# Patient Record
Sex: Female | Born: 1990 | Race: Black or African American | Hispanic: No | Marital: Single | Smoking: Never smoker
Health system: Southern US, Community
[De-identification: ages and names within clinical notes are randomized; demographics above are authoritative.]

## PROBLEM LIST (undated history)

## (undated) ENCOUNTER — Inpatient Hospital Stay (HOSPITAL_COMMUNITY): Payer: Self-pay

## (undated) DIAGNOSIS — Z789 Other specified health status: Secondary | ICD-10-CM

## (undated) HISTORY — PX: NOSE SURGERY: SHX723

---

## 2013-10-01 DIAGNOSIS — B951 Streptococcus, group B, as the cause of diseases classified elsewhere: Secondary | ICD-10-CM | POA: Insufficient documentation

## 2013-11-29 ENCOUNTER — Encounter: Payer: Self-pay | Admitting: *Deleted

## 2013-12-06 ENCOUNTER — Encounter: Payer: Self-pay | Admitting: Obstetrics and Gynecology

## 2013-12-20 ENCOUNTER — Ambulatory Visit (INDEPENDENT_AMBULATORY_CARE_PROVIDER_SITE_OTHER): Payer: BC Managed Care – PPO | Admitting: Obstetrics & Gynecology

## 2013-12-20 ENCOUNTER — Encounter: Payer: Self-pay | Admitting: Obstetrics & Gynecology

## 2013-12-20 VITALS — BP 126/84 | Temp 97.3°F | Ht 66.0 in | Wt 222.9 lb

## 2013-12-20 DIAGNOSIS — O2390 Unspecified genitourinary tract infection in pregnancy, unspecified trimester: Secondary | ICD-10-CM

## 2013-12-20 DIAGNOSIS — N390 Urinary tract infection, site not specified: Secondary | ICD-10-CM

## 2013-12-20 DIAGNOSIS — Z3490 Encounter for supervision of normal pregnancy, unspecified, unspecified trimester: Secondary | ICD-10-CM | POA: Insufficient documentation

## 2013-12-20 DIAGNOSIS — Z23 Encounter for immunization: Secondary | ICD-10-CM

## 2013-12-20 DIAGNOSIS — B951 Streptococcus, group B, as the cause of diseases classified elsewhere: Secondary | ICD-10-CM

## 2013-12-20 DIAGNOSIS — O3663X1 Maternal care for excessive fetal growth, third trimester, fetus 1: Secondary | ICD-10-CM

## 2013-12-20 DIAGNOSIS — O3660X0 Maternal care for excessive fetal growth, unspecified trimester, not applicable or unspecified: Secondary | ICD-10-CM

## 2013-12-20 DIAGNOSIS — Z3493 Encounter for supervision of normal pregnancy, unspecified, third trimester: Secondary | ICD-10-CM

## 2013-12-20 LAB — POCT URINALYSIS DIP (DEVICE)
Bilirubin Urine: NEGATIVE
Glucose, UA: NEGATIVE mg/dL
Hgb urine dipstick: NEGATIVE
Specific Gravity, Urine: >=1.03 (ref 1.005–1.030)
Urobilinogen, UA: 0.2 mg/dL (ref 0.0–1.0)
pH: 5.5 (ref 5.0–8.0)

## 2013-12-20 LAB — CBC
MCH: 27.1 pg (ref 26.0–34.0)
MCHC: 34.6 g/dL (ref 30.0–36.0)
MCV: 78.3 fL (ref 78.0–100.0)
Platelets: 251 10*3/uL (ref 150–400)
RDW: 14 % (ref 11.5–15.5)

## 2013-12-20 MED ORDER — TETANUS-DIPHTH-ACELL PERTUSSIS 5-2.5-18.5 LF-MCG/0.5 IM SUSP
0.5000 mL | Freq: Once | INTRAMUSCULAR | Status: AC
  Administered 2013-12-20: 0.5 mL via INTRAMUSCULAR

## 2013-12-20 NOTE — Progress Notes (Signed)
Transferred from [facility].  Uncomplicated pregnancy thus far.  Awaiting lab records from [facility]. Third trimester labs today, Tdap also given.  Ultrasound ordered for large for dates. No other complaints or concerns.  Fetal movement and labor precautions reviewed.

## 2013-12-20 NOTE — Progress Notes (Signed)
U/S scheduled 12/24/13 at 8 am.

## 2013-12-20 NOTE — Patient Instructions (Signed)
Return to clinic for any obstetric concerns or go to [facility] for evaluation Breastfeeding Deciding to breastfeed is one of the best choices you can make for you and your baby. A change in hormones during pregnancy causes your breast tissue to grow and increases the number and size of your milk ducts. These hormones also allow proteins, sugars, and fats from your blood supply to make breast milk in your milk-producing glands. Hormones prevent breast milk from being released before your baby is born as well as prompt milk flow after birth. Once breastfeeding has begun, thoughts of your baby, as well as his or her sucking or crying, can stimulate the release of milk from your milk-producing glands.  BENEFITS OF BREASTFEEDING For Your Baby  Your first milk (colostrum) helps your baby's digestive system function better.   There are antibodies in your milk that help your baby fight off infections.   Your baby has a lower incidence of asthma, allergies, and sudden infant death syndrome.   The nutrients in breast milk are better for your baby than infant formulas and are designed uniquely for your baby's needs.   Breast milk improves your baby's brain development.   Your baby is less likely to develop other conditions, such as childhood obesity, asthma, or type 2 diabetes mellitus.  For You   Breastfeeding helps to create a very special bond between you and your baby.   Breastfeeding is convenient. Breast milk is always available at the correct temperature and costs nothing.   Breastfeeding helps to burn calories and helps you lose the weight gained during pregnancy.   Breastfeeding makes your uterus contract to its prepregnancy size faster and slows bleeding (lochia) after you give birth.   Breastfeeding helps to lower your risk of developing type 2 diabetes mellitus, osteoporosis, and breast or ovarian cancer later in life. SIGNS THAT YOUR BABY IS HUNGRY Early Signs of  Hunger  Increased alertness or activity.  Stretching.  Movement of the head from side to side.  Movement of the head and opening of the mouth when the corner of the mouth or cheek is stroked (rooting).  Increased sucking sounds, smacking lips, cooing, sighing, or squeaking.  Hand-to-mouth movements.  Increased sucking of fingers or hands. Late Signs of Hunger  Fussing.  Intermittent crying. Extreme Signs of Hunger Signs of extreme hunger will require calming and consoling before your baby will be able to breastfeed successfully. Do not wait for the following signs of extreme hunger to occur before you initiate breastfeeding:   Restlessness.  A loud, strong cry.   Screaming. BREASTFEEDING BASICS Breastfeeding Initiation  Find a comfortable place to sit or lie down, with your neck and back well supported.  Place a pillow or rolled up blanket under your baby to bring him or her to the level of your breast (if you are seated). Nursing pillows are specially designed to help support your arms and your baby while you breastfeed.  Make sure that your baby's abdomen is facing your abdomen.   Gently massage your breast. With your fingertips, massage from your chest wall toward your nipple in a circular motion. This encourages milk flow. You may need to continue this action during the feeding if your milk flows slowly.  Support your breast with 4 fingers underneath and your thumb above your nipple. Make sure your fingers are well away from your nipple and your baby's mouth.   Stroke your baby's lips gently with your finger or nipple.   When   your baby's mouth is open wide enough, quickly bring your baby to your breast, placing your entire nipple and as much of the colored area around your nipple (areola) as possible into your baby's mouth.   More areola should be visible above your baby's upper lip than below the lower lip.   Your baby's tongue should be between his or her  lower gum and your breast.   Ensure that your baby's mouth is correctly positioned around your nipple (latched). Your baby's lips should create a seal on your breast and be turned out (everted).  It is common for your baby to suck about 2 3 minutes in order to start the flow of breast milk. Latching Teaching your baby how to latch on to your breast properly is very important. An improper latch can cause nipple pain and decreased milk supply for you and poor weight gain in your baby. Also, if your baby is not latched onto your nipple properly, he or she may swallow some air during feeding. This can make your baby fussy. Burping your baby when you switch breasts during the feeding can help to get rid of the air. However, teaching your baby to latch on properly is still the best way to prevent fussiness from swallowing air while breastfeeding. Signs that your baby has successfully latched on to your nipple:    Silent tugging or silent sucking, without causing you pain.   Swallowing heard between every 3 4 sucks.    Muscle movement above and in front of his or her ears while sucking.  Signs that your baby has not successfully latched on to nipple:   Sucking sounds or smacking sounds from your baby while breastfeeding.  Nipple pain. If you think your baby has not latched on correctly, slip your finger into the corner of your baby's mouth to break the suction and place it between your baby's gums. Attempt breastfeeding initiation again. Signs of Successful Breastfeeding Signs from your baby:   A gradual decrease in the number of sucks or complete cessation of sucking.   Falling asleep.   Relaxation of his or her body.   Retention of a small amount of milk in his or her mouth.   Letting go of your breast by himself or herself. Signs from you:  Breasts that have increased in firmness, weight, and size 1 3 hours after feeding.   Breasts that are softer immediately after  breastfeeding.  Increased milk volume, as well as a change in milk consistency and color by the 5th day of breastfeeding.   Nipples that are not sore, cracked, or bleeding. Signs That Your Baby is Getting Enough Milk  Wetting at least 3 diapers in a 24-hour period. The urine should be clear and pale yellow by age 5 days.  At least 3 stools in a 24-hour period by age 5 days. The stool should be soft and yellow.  At least 3 stools in a 24-hour period by age 7 days. The stool should be seedy and yellow.  No loss of weight greater than 10% of birth weight during the first 3 days of age.  Average weight gain of 4 7 ounces (120 210 mL) per week after age 4 days.  Consistent daily weight gain by age 5 days, without weight loss after the age of 2 weeks. After a feeding, your baby may spit up a small amount. This is common. BREASTFEEDING FREQUENCY AND DURATION Frequent feeding will help you make more milk and can prevent   sore nipples and breast engorgement. Breastfeed when you feel the need to reduce the fullness of your breasts or when your baby shows signs of hunger. This is called "breastfeeding on demand." Avoid introducing a pacifier to your baby while you are working to establish breastfeeding (the first 4 6 weeks after your baby is born). After this time you may choose to use a pacifier. Research has shown that pacifier use during the first year of a baby's life decreases the risk of sudden infant death syndrome (SIDS). Allow your baby to feed on each breast as long as he or she wants. Breastfeed until your baby is finished feeding. When your baby unlatches or falls asleep while feeding from the first breast, offer the second breast. Because newborns are often sleepy in the first few weeks of life, you may need to awaken your baby to get him or her to feed. Breastfeeding times will vary from baby to baby. However, the following rules can serve as a guide to help you ensure that your baby is  properly fed:  Newborns (babies 4 weeks of age or younger) may breastfeed every 1 3 hours.  Newborns should not go longer than 3 hours during the day or 5 hours during the night without breastfeeding.  You should breastfeed your baby a minimum of 8 times in a 24-hour period until you begin to introduce solid foods to your baby at around 6 months of age. BREAST MILK PUMPING Pumping and storing breast milk allows you to ensure that your baby is exclusively fed your breast milk, even at times when you are unable to breastfeed. This is especially important if you are going back to work while you are still breastfeeding or when you are not able to be present during feedings. Your lactation consultant can give you guidelines on how long it is safe to store breast milk.  A breast pump is a machine that allows you to pump milk from your breast into a sterile bottle. The pumped breast milk can then be stored in a refrigerator or freezer. Some breast pumps are operated by hand, while others use electricity. Ask your lactation consultant which type will work best for you. Breast pumps can be purchased, but some hospitals and breastfeeding support groups lease breast pumps on a monthly basis. A lactation consultant can teach you how to hand express breast milk, if you prefer not to use a pump.  CARING FOR YOUR BREASTS WHILE YOU BREASTFEED Nipples can become dry, cracked, and sore while breastfeeding. The following recommendations can help keep your breasts moisturized and healthy:  Avoid using soap on your nipples.   Wear a supportive bra. Although not required, special nursing bras and tank tops are designed to allow access to your breasts for breastfeeding without taking off your entire bra or top. Avoid wearing underwire style bras or extremely tight bras.  Air dry your nipples for 3 4minutes after each feeding.   Use only cotton bra pads to absorb leaked breast milk. Leaking of breast milk between  feedings is normal.   Use lanolin on your nipples after breastfeeding. Lanolin helps to maintain your skin's normal moisture barrier. If you use pure lanolin you do not need to wash it off before feeding your baby again. Pure lanolin is not toxic to your baby. You may also hand express a few drops of breast milk and gently massage that milk into your nipples and allow the milk to air dry. In the first few weeks   after giving birth, some women experience extremely full breasts (engorgement). Engorgement can make your breasts feel heavy, warm, and tender to the touch. Engorgement peaks within 3 5 days after you give birth. The following recommendations can help ease engorgement:  Completely empty your breasts while breastfeeding or pumping. You may want to start by applying warm, moist heat (in the shower or with warm water-soaked hand towels) just before feeding or pumping. This increases circulation and helps the milk flow. If your baby does not completely empty your breasts while breastfeeding, pump any extra milk after he or she is finished.  Wear a snug bra (nursing or regular) or tank top for 1 2 days to signal your body to slightly decrease milk production.  Apply ice packs to your breasts, unless this is too uncomfortable for you.  Make sure that your baby is latched on and positioned properly while breastfeeding. If engorgement persists after 48 hours of following these recommendations, contact your health care provider or a lactation consultant. OVERALL HEALTH CARE RECOMMENDATIONS WHILE BREASTFEEDING  Eat healthy foods. Alternate between meals and snacks, eating 3 of each per day. Because what you eat affects your breast milk, some of the foods may make your baby more irritable than usual. Avoid eating these foods if you are sure that they are negatively affecting your baby.  Drink milk, fruit juice, and water to satisfy your thirst (about 10 glasses a day).   Rest often, relax, and  continue to take your prenatal vitamins to prevent fatigue, stress, and anemia.  Continue breast self-awareness checks.  Avoid chewing and smoking tobacco.  Avoid alcohol and drug use. Some medicines that may be harmful to your baby can pass through breast milk. It is important to ask your health care provider before taking any medicine, including all over-the-counter and prescription medicine as well as vitamin and herbal supplements. It is possible to become pregnant while breastfeeding. If birth control is desired, ask your health care provider about options that will be safe for your baby. SEEK MEDICAL CARE IF:   You feel like you want to stop breastfeeding or have become frustrated with breastfeeding.  You have painful breasts or nipples.  Your nipples are cracked or bleeding.  Your breasts are red, tender, or warm.  You have a swollen area on either breast.  You have a fever or chills.  You have nausea or vomiting.  You have drainage other than breast milk from your nipples.  Your breasts do not become full before feedings by the 5th day after you give birth.  You feel sad and depressed.  Your baby is too sleepy to eat well.  Your baby is having trouble sleeping.   Your baby is wetting less than 3 diapers in a 24-hour period.  Your baby has less than 3 stools in a 24-hour period.  Your baby's skin or the white part of his or her eyes becomes yellow.   Your baby is not gaining weight by 5 days of age. SEEK IMMEDIATE MEDICAL CARE IF:   Your baby is overly tired (lethargic) and does not want to wake up and feed.  Your baby develops an unexplained fever. Document Released: 12/16/2005 Document Revised: 08/18/2013 Document Reviewed: 06/09/2013 [employer] Patient Information 2014 [employer].  

## 2013-12-20 NOTE — Progress Notes (Signed)
Nutrition note: 1st visit consult Pt has h/o obesity.  Pt has lost 1.1# @ [redacted]w[redacted]d but pt reports her lowest wt was 218# and so has gained wt recently. Pt reports eating only 2x/d due to no appetite. Diet appears low in fruits & vegetables.  Pt reports no more N/V but has some heartburn. Pt is taking PNV. Pt received verbal & written education on general nutrition during pregnancy. Discussed tips to decrease heartburn. Encouraged pt to add 2-3 energy dense snacks/d. Discussed benefits of BF. Encouraged fruits & vegetables daily.  Discussed wt gain goals of 11-20# or 0.5#/wk. Pt agrees to continue taking PNV and try to add snacks throughout the day. Pt does not have WIC but plans to apply. Pt plans to BF. F/u in 2-4 wks Blondell Reveal, MS, RD, LDN, [facility]

## 2013-12-20 NOTE — Progress Notes (Signed)
Pulse- 92 Patient reports hip pain

## 2013-12-21 ENCOUNTER — Encounter: Payer: Self-pay | Admitting: *Deleted

## 2013-12-21 LAB — PRESCRIPTION MONITORING PROFILE (19 PANEL)
Amphetamine/Meth: NEGATIVE ng/mL
Barbiturate Screen, Urine: NEGATIVE ng/mL
Benzodiazepine Screen, Urine: NEGATIVE ng/mL
Buprenorphine, Urine: NEGATIVE ng/mL
Cannabinoid Scrn, Ur: NEGATIVE ng/mL
Carisoprodol, Urine: NEGATIVE ng/mL
Cocaine Metabolites: NEGATIVE ng/mL
Creatinine, Urine: 415.23 mg/dL
Fentanyl, Ur: NEGATIVE ng/mL
MDMA URINE: NEGATIVE ng/mL
Meperidine, Ur: NEGATIVE ng/mL
Methadone Screen, Urine: NEGATIVE ng/mL
Methaqualone: NEGATIVE ng/mL
Nitrites, Initial: NEGATIVE ug/mL
Opiate Screen, Urine: NEGATIVE ng/mL
Oxycodone Screen, Ur: NEGATIVE ng/mL
Phencyclidine, Ur: NEGATIVE ng/mL
Propoxyphene: NEGATIVE ng/mL
Tapentadol, urine: NEGATIVE ng/mL
Tramadol Scrn, Ur: NEGATIVE ng/mL
Zolpidem, Urine: NEGATIVE ng/mL
pH, Initial: 5.8 pH (ref 4.5–8.9)

## 2013-12-24 ENCOUNTER — Ambulatory Visit (HOSPITAL_COMMUNITY)
Admission: RE | Admit: 2013-12-24 | Discharge: 2013-12-24 | Disposition: A | Discharge status: Home / Self Care | Payer: BC Managed Care – PPO | Source: Ambulatory Visit | Attending: Obstetrics & Gynecology | Admitting: Obstetrics & Gynecology

## 2013-12-24 DIAGNOSIS — O3663X1 Maternal care for excessive fetal growth, third trimester, fetus 1: Secondary | ICD-10-CM

## 2013-12-24 DIAGNOSIS — Z3689 Encounter for other specified antenatal screening: Secondary | ICD-10-CM | POA: Insufficient documentation

## 2013-12-24 DIAGNOSIS — E669 Obesity, unspecified: Secondary | ICD-10-CM | POA: Insufficient documentation

## 2013-12-24 DIAGNOSIS — O3660X0 Maternal care for excessive fetal growth, unspecified trimester, not applicable or unspecified: Secondary | ICD-10-CM | POA: Insufficient documentation

## 2013-12-24 DIAGNOSIS — Z3493 Encounter for supervision of normal pregnancy, unspecified, third trimester: Secondary | ICD-10-CM

## 2013-12-27 ENCOUNTER — Encounter: Payer: Self-pay | Admitting: Obstetrics & Gynecology

## 2013-12-27 DIAGNOSIS — O99210 Obesity complicating pregnancy, unspecified trimester: Secondary | ICD-10-CM | POA: Insufficient documentation

## 2013-12-30 NOTE — L&D Delivery Note (Signed)
Delivery Note At 7:24 PM a viable female was delivered via Vaginal, Spontaneous Delivery (Presentation: LOA ).  APGAR: 9, 9; weight 5 lb 14 oz (2665 g).   Placenta status: Intact, Spontaneous.  Cord: 3 vessels with the following complications: tight body cord wrapped around one leg, one arm and the neck.    Anesthesia: Epidural  Episiotomy: None Lacerations: Labial;Sulcus Suture Repair: 3.0 vicryl rapide Est. Blood Loss (mL): 350  Mom to postpartum.  Baby to Couplet care / Skin to Skin.  Pt pushed with good maternal effort with 3 contractions to deliver a liveborn female with a spontaneous cry. Tight body cord unable to to removed on perineum but delivered through without complication.  Delayed cord clamping performed. Placenta delivered intact with 3V cord with traction and pit.  Small sulcal tear requiring a single figure of 8 for hemostasis and a left labial requiring a running suture for hemostasis were repaired without difficulty.  No complications.  Baby to skin to skin and mom to postpartum.    Rance Smithson L 03/09/2014, 8:50 PM

## 2014-01-03 ENCOUNTER — Ambulatory Visit (INDEPENDENT_AMBULATORY_CARE_PROVIDER_SITE_OTHER): Payer: BC Managed Care – PPO | Admitting: Family Medicine

## 2014-01-03 VITALS — BP 127/84 | Temp 98.1°F | Wt 225.5 lb

## 2014-01-03 DIAGNOSIS — N390 Urinary tract infection, site not specified: Secondary | ICD-10-CM

## 2014-01-03 DIAGNOSIS — B951 Streptococcus, group B, as the cause of diseases classified elsewhere: Secondary | ICD-10-CM

## 2014-01-03 DIAGNOSIS — O2390 Unspecified genitourinary tract infection in pregnancy, unspecified trimester: Secondary | ICD-10-CM

## 2014-01-03 DIAGNOSIS — Z3491 Encounter for supervision of normal pregnancy, unspecified, first trimester: Secondary | ICD-10-CM

## 2014-01-03 DIAGNOSIS — Z23 Encounter for immunization: Secondary | ICD-10-CM

## 2014-01-03 DIAGNOSIS — O2340 Unspecified infection of urinary tract in pregnancy, unspecified trimester: Principal | ICD-10-CM

## 2014-01-03 LAB — POCT URINALYSIS DIP (DEVICE)
BILIRUBIN URINE: NEGATIVE
Glucose, UA: NEGATIVE mg/dL
Hgb urine dipstick: NEGATIVE
LEUKOCYTES UA: NEGATIVE
Nitrite: NEGATIVE
PROTEIN: NEGATIVE mg/dL
Urobilinogen, UA: 1 mg/dL (ref 0.0–1.0)
pH: 6 (ref 5.0–8.0)

## 2014-01-03 NOTE — Progress Notes (Signed)
No LOF, no vb, +braxton hicks 1x/week, Normal FM  Miakoda Mabry is a 23 y.o. G1P0000 at 4837w3d by L=8 here for ROB visit.  Discussed with Patient:  -Plans to breast feed.  All questions answered. -Continue prenatal vitamins. -Reviewed fetal kick counts (Pt to perform daily at a time when the baby is active, lie laterally with both hands on belly in quiet room and count all movements (hiccups, shoulder rolls, obvious kicks, etc); pt is to report to clinic or [facility] for less than 10 movements felt in a one hour time period-pt told as soon as she counts 10 movements the count is complete.)  - Routine precautions discussed (depression, infection s/s).   Patient provided with all pertinent phone numbers for emergencies. - RTC for any VB, regular, painful cramps/ctxs occurring at a rate of >2/10 min, fever (100.5 or higher), n/v/d, any pain that is unresolving or worsening, LOF, decreased fetal movement, CP, SOB, edema  Problems: Patient Active Problem List   Diagnosis Date Noted  . Maternal obesity, antepartum 12/27/2013  . Supervision of normal pregnancy 12/20/2013  . GBS (group B streptococcus) UTI complicating pregnancy 10/01/2013    To Do:   [ ]  Vaccines: Flu: declines Tdap: recd [ ]  BCM: skyla vs nuvaring  Edu: [x ] PTL precautions; [ ]  BF class; [ ]  childbirth class; [ ]   BF counseling;

## 2014-01-03 NOTE — Progress Notes (Signed)
P= 82 C/o of heartburn this morning.

## 2014-01-03 NOTE — Addendum Note (Signed)
Addended by: Louanna RawAMPBELL, Kamaron Deskins M on: 01/03/2014 09:58 AM   Modules accepted: Orders

## 2014-01-03 NOTE — Patient Instructions (Signed)
Third Trimester of Pregnancy  The third trimester is from week 29 through week 42, months 7 through 9. The third trimester is a time when the fetus is growing rapidly. At the end of the ninth month, the fetus is about 20 inches in length and weighs 6 10 pounds.   BODY CHANGES  Your body goes through many changes during pregnancy. The changes vary from woman to woman.    Your weight will continue to increase. You can expect to gain 25 35 pounds (11 16 kg) by the end of the pregnancy.   You may begin to get stretch marks on your hips, abdomen, and breasts.   You may urinate more often because the fetus is moving lower into your pelvis and pressing on your bladder.   You may develop or continue to have heartburn as a result of your pregnancy.   You may develop constipation because certain hormones are causing the muscles that push waste through your intestines to slow down.   You may develop hemorrhoids or swollen, bulging veins (varicose veins).   You may have pelvic pain because of the weight gain and pregnancy hormones relaxing your joints between the bones in your pelvis. Back aches may result from over exertion of the muscles supporting your posture.   Your breasts will continue to grow and be tender. A yellow discharge may leak from your breasts called colostrum.   Your belly button may stick out.   You may feel short of breath because of your expanding uterus.   You may notice the fetus "dropping," or moving lower in your abdomen.   You may have a bloody mucus discharge. This usually occurs a few days to a week before labor begins.   Your cervix becomes thin and soft (effaced) near your due date.  WHAT TO EXPECT AT YOUR PRENATAL EXAMS   You will have prenatal exams every 2 weeks until week 36. Then, you will have weekly prenatal exams. During a routine prenatal visit:   You will be weighed to make sure you and the fetus are growing normally.   Your blood pressure is taken.   Your abdomen will be  measured to track your baby's growth.   The fetal heartbeat will be listened to.   Any test results from the previous visit will be discussed.   You may have a cervical check near your due date to see if you have effaced.  At around 36 weeks, your caregiver will check your cervix. At the same time, your caregiver will also perform a test on the secretions of the vaginal tissue. This test is to determine if a type of bacteria, Group B streptococcus, is present. Your caregiver will explain this further.  Your caregiver may ask you:   What your birth plan is.   How you are feeling.   If you are feeling the baby move.   If you have had any abnormal symptoms, such as leaking fluid, bleeding, severe headaches, or abdominal cramping.   If you have any questions.  Other tests or screenings that may be performed during your third trimester include:   Blood tests that check for low iron levels (anemia).   Fetal testing to check the health, activity level, and growth of the fetus. Testing is done if you have certain medical conditions or if there are problems during the pregnancy.  FALSE LABOR  You may feel small, irregular contractions that eventually go away. These are called Braxton Hicks contractions, or   false labor. Contractions may last for hours, days, or even weeks before true labor sets in. If contractions come at regular intervals, intensify, or become painful, it is best to be seen by your caregiver.   SIGNS OF LABOR    Menstrual-like cramps.   Contractions that are 5 minutes apart or less.   Contractions that start on the top of the uterus and spread down to the lower abdomen and back.   A sense of increased pelvic pressure or back pain.   A watery or bloody mucus discharge that comes from the vagina.  If you have any of these signs before the 37th week of pregnancy, call your caregiver right away. You need to go to the hospital to get checked immediately.  HOME CARE INSTRUCTIONS    Avoid all  smoking, herbs, alcohol, and unprescribed drugs. These chemicals affect the formation and growth of the baby.   Follow your caregiver's instructions regarding medicine use. There are medicines that are either safe or unsafe to take during pregnancy.   Exercise only as directed by your caregiver. Experiencing uterine cramps is a good sign to stop exercising.   Continue to eat regular, healthy meals.   Wear a good support bra for breast tenderness.   Do not use hot tubs, steam rooms, or saunas.   Wear your seat belt at all times when driving.   Avoid raw meat, uncooked cheese, cat litter boxes, and soil used by cats. These carry germs that can cause birth defects in the baby.   Take your prenatal vitamins.   Try taking a stool softener (if your caregiver approves) if you develop constipation. Eat more high-fiber foods, such as fresh vegetables or fruit and whole grains. Drink plenty of fluids to keep your urine clear or pale yellow.   Take warm sitz baths to soothe any pain or discomfort caused by hemorrhoids. Use hemorrhoid cream if your caregiver approves.   If you develop varicose veins, wear support hose. Elevate your feet for 15 minutes, 3 4 times a day. Limit salt in your diet.   Avoid heavy lifting, wear low heal shoes, and practice good posture.   Rest a lot with your legs elevated if you have leg cramps or low back pain.   Visit your dentist if you have not gone during your pregnancy. Use a soft toothbrush to brush your teeth and be gentle when you floss.   A sexual relationship may be continued unless your caregiver directs you otherwise.   Do not travel far distances unless it is absolutely necessary and only with the approval of your caregiver.   Take prenatal classes to understand, practice, and ask questions about the labor and delivery.   Make a trial run to the hospital.   Pack your hospital bag.   Prepare the baby's nursery.   Continue to go to all your prenatal visits as directed  by your caregiver.  SEEK MEDICAL CARE IF:   You are unsure if you are in labor or if your water has broken.   You have dizziness.   You have mild pelvic cramps, pelvic pressure, or nagging pain in your abdominal area.   You have persistent nausea, vomiting, or diarrhea.   You have a bad smelling vaginal discharge.   You have pain with urination.  SEEK IMMEDIATE MEDICAL CARE IF:    You have a fever.   You are leaking fluid from your vagina.   You have spotting or bleeding from your vagina.     You have severe abdominal cramping or pain.   You have rapid weight loss or gain.   You have shortness of breath with chest pain.   You notice sudden or extreme swelling of your face, hands, ankles, feet, or legs.   You have not felt your baby move in over an hour.   You have severe headaches that do not go away with medicine.   You have vision changes.  Document Released: 12/10/2001 Document Revised: 08/18/2013 Document Reviewed: 02/16/2013  Exit[employer] Patient Information 2014 [employer].

## 2014-01-05 ENCOUNTER — Encounter: Payer: Self-pay | Admitting: *Deleted

## 2014-01-10 ENCOUNTER — Encounter (HOSPITAL_COMMUNITY): Payer: Self-pay

## 2014-01-10 ENCOUNTER — Inpatient Hospital Stay (HOSPITAL_COMMUNITY)
Admission: AD | Admit: 2014-01-10 | Discharge: 2014-01-10 | Disposition: A | Discharge status: Home / Self Care | Payer: BC Managed Care – PPO | Source: Ambulatory Visit | Attending: Family Medicine | Admitting: Family Medicine

## 2014-01-10 DIAGNOSIS — M545 Low back pain, unspecified: Secondary | ICD-10-CM | POA: Insufficient documentation

## 2014-01-10 DIAGNOSIS — O479 False labor, unspecified: Secondary | ICD-10-CM

## 2014-01-10 DIAGNOSIS — O4700 False labor before 37 completed weeks of gestation, unspecified trimester: Secondary | ICD-10-CM | POA: Insufficient documentation

## 2014-01-10 LAB — URINALYSIS, ROUTINE W REFLEX MICROSCOPIC
BILIRUBIN URINE: NEGATIVE
GLUCOSE, UA: NEGATIVE mg/dL
Hgb urine dipstick: NEGATIVE
Ketones, ur: NEGATIVE mg/dL
Leukocytes, UA: NEGATIVE
NITRITE: NEGATIVE
PH: 6 (ref 5.0–8.0)
Protein, ur: NEGATIVE mg/dL
SPECIFIC GRAVITY, URINE: 1.01 (ref 1.005–1.030)
Urobilinogen, UA: 0.2 mg/dL (ref 0.0–1.0)

## 2014-01-10 NOTE — Discharge Instructions (Signed)

## 2014-01-10 NOTE — MAU Note (Signed)
Pt reports abd pain earlier today, back pain last night. No pain now. Denies bleeding or ROM.

## 2014-01-10 NOTE — MAU Provider Note (Signed)
Chief Complaint:  Contractions  First Contact Initiated at 10:45 PM  HPI: Alexis Lambert is a 23 y.o. G1P0000 at 6876w3d who presents to maternity admissions reporting low back pain last night and abdominal discomfort today.  Described as lower abdominal pain that wax and waned, along with her back pain, which is non radiating.  She denies any dysuria, hematuria, vaginal discharge, RLQ pain, fever, chills, or sweats.  Denies contractions, leakage of fluid or vaginal bleeding. Good fetal movement.   Pregnancy Course: Uncomplicated  Past Medical History: No past medical history on file.  Past obstetric history: OB History  Gravida Para Term Preterm AB SAB TAB Ectopic Multiple Living  1 0 0 0 0 0 0 0 0 0     # Outcome Date GA Lbr Len/2nd Weight Sex Delivery Anes PTL Lv  1 CUR               Past Surgical History: Past Surgical History  Procedure Laterality Date  . Nose surgery       Family History: Family History  Problem Relation Age of Onset  . Sarcoidosis Mother   . Hypertension Father   . Diabetes Paternal Grandmother   . Hypertension Paternal Grandmother     Social History: History  Substance Use Topics  . Smoking status: Never Smoker   . Smokeless tobacco: Never Used  . Alcohol Use: No    Allergies: No Known Allergies  Meds:  Prescriptions prior to admission  Medication Sig Dispense Refill  . Prenatal Vit-Fe Fumarate-FA (PRENATAL VITAMINS PLUS) 27-1 MG TABS Take by mouth.        ROS: Pertinent findings in history of present illness.  Physical Exam  Blood pressure 137/80, pulse 93, temperature 98.8 F (37.1 C), temperature source Oral, resp. rate 18, height 5\' 6"  (1.676 m), weight 104.327 kg (230 lb), last menstrual period 06/04/2013, SpO2 99.00%. GENERAL: Well-developed, well-nourished female in no acute distress.  HEENT: normocephalic HEART: normal rate RESP: normal effort ABDOMEN: Soft, non-tender, gravid appropriate for gestational age EXTREMITIES:  Nontender, no edema NEURO: alert and oriented Dilation: Closed Effacement (%): Thick Cervical Position: Posterior Station: -3 Exam by:: Dr. Paulina FusiHess   FHT:  Baseline 140 , moderate variability, accelerations present, no decelerations Contractions: Absent    Labs: No results found for this or any previous visit (from the past 24 hour(s)).  Imaging:  [place] Ob Comp + 14 Wk  12/24/2013   OBSTETRICAL ULTRASOUND: This exam was performed within a [facility]. The OB US report was generated in the [facility] system, and faxed to the ordering physician.   This report is also available in [facility]'s [employer] and in the [facility] [employer] PACS. See [facility] Obstetric US report.   Assessment: 1. Braxton Hick's contraction     Plan: Discharge home Reassuring FHT Labor precautions and fetal kick counts    Medication List    ASK your doctor about these medications       prenatal multivitamin Tabs tablet  Take 1 tablet by mouth daily at 12 noon.        Briscoe DeutscherBryan R Hess, DO 01/10/2014 10:08 PM  I spoke with the resident and reviewed his note regarding patient and agree with and plan of care.  Tawana ScaleMichael Ryan Quaniya Damas, MD OB Fellow 01/11/2014 12:43 AM

## 2014-01-11 NOTE — MAU Provider Note (Signed)
Attestation of Attending Supervision of Advanced Practitioner (PA/CNM/NP): Evaluation and management procedures were performed by the Advanced Practitioner under my supervision and collaboration.  I have reviewed the Advanced Practitioner's note and chart, and I agree with the management and plan.  Reva BoresPRATT,Yanique Mulvihill S, MD [facility] Attending 01/11/2014 12:48 AM

## 2014-01-16 ENCOUNTER — Inpatient Hospital Stay (HOSPITAL_COMMUNITY)
Admission: AD | Admit: 2014-01-16 | Discharge: 2014-01-16 | Disposition: A | Discharge status: Home / Self Care | Payer: BC Managed Care – PPO | Source: Ambulatory Visit | Attending: Obstetrics & Gynecology | Admitting: Obstetrics & Gynecology

## 2014-01-16 ENCOUNTER — Encounter (HOSPITAL_COMMUNITY): Payer: Self-pay | Admitting: *Deleted

## 2014-01-16 DIAGNOSIS — R300 Dysuria: Secondary | ICD-10-CM | POA: Insufficient documentation

## 2014-01-16 DIAGNOSIS — N938 Other specified abnormal uterine and vaginal bleeding: Secondary | ICD-10-CM | POA: Insufficient documentation

## 2014-01-16 DIAGNOSIS — N390 Urinary tract infection, site not specified: Secondary | ICD-10-CM

## 2014-01-16 DIAGNOSIS — N949 Unspecified condition associated with female genital organs and menstrual cycle: Secondary | ICD-10-CM | POA: Insufficient documentation

## 2014-01-16 LAB — URINE MICROSCOPIC-ADD ON

## 2014-01-16 LAB — URINALYSIS, ROUTINE W REFLEX MICROSCOPIC
BILIRUBIN URINE: NEGATIVE
Glucose, UA: NEGATIVE mg/dL
Ketones, ur: 15 mg/dL — AB
Nitrite: NEGATIVE
PH: 6 (ref 5.0–8.0)
Protein, ur: NEGATIVE mg/dL
SPECIFIC GRAVITY, URINE: 1.02 (ref 1.005–1.030)
Urobilinogen, UA: 0.2 mg/dL (ref 0.0–1.0)

## 2014-01-16 MED ORDER — NITROFURANTOIN MONOHYD MACRO 100 MG PO CAPS: 100.0000 mg | ORAL_CAPSULE | Freq: Two times a day (BID) | ORAL | Status: DC

## 2014-01-16 NOTE — MAU Note (Signed)
Pt presents with complaints of vaginal bleeding and pain with urination this morning

## 2014-01-16 NOTE — MAU Provider Note (Signed)
History     CSN: [id number]  Arrival date and time: 01/16/14 [id number]   First Provider Initiated Contact with Patient 01/16/14 [phone]      Chief Complaint  Patient presents with  . Dysuria  . Vaginal Bleeding   HPI Comments: Alexis Lambert 22 y.o. G1P0000 presents to MAU with bleeding when she wiped after painful urination. She had intercourse last night. She is 32 weeks and 2 days pregnant. She admits she does not drink as much as she should because she is so busy.     Dysuria   Vaginal Bleeding Associated symptoms include dysuria.      History reviewed. No pertinent past medical history.  Past Surgical History  Procedure Laterality Date  . Nose surgery      Family History  Problem Relation Age of Onset  . Sarcoidosis Mother   . Hypertension Father   . Diabetes Paternal Grandmother   . Hypertension Paternal Grandmother     History  Substance Use Topics  . Smoking status: Never Smoker   . Smokeless tobacco: Never Used  . Alcohol Use: No    Allergies: No Known Allergies  Prescriptions prior to admission  Medication Sig Dispense Refill  . Prenatal Vit-Fe Fumarate-FA (PRENATAL MULTIVITAMIN) TABS tablet Take 1 tablet by mouth daily at 12 noon.        Review of Systems  Constitutional: Negative.        Denies any contractions  HENT: Negative.   Eyes: Negative.   Respiratory: Negative.   Cardiovascular: Negative.   Gastrointestinal: Negative.   Genitourinary: Positive for dysuria and vaginal bleeding.  Musculoskeletal: Negative.   Skin: Negative.   Neurological: Negative.   Psychiatric/Behavioral: Negative.    Physical Exam   Blood pressure 138/79, pulse 83, temperature 98.3 F (36.8 C), temperature source Oral, resp. rate 18, height 5\' 5"  (1.651 m), weight 105.235 kg (232 lb), last menstrual period 06/04/2013.  Physical Exam  Constitutional: She is oriented to person, place, and time. She appears well-developed and well-nourished. No distress.  HENT:   Head: Normocephalic and atraumatic.  Eyes: Pupils are equal, round, and reactive to light.  GI: Soft. She exhibits no distension and no mass. There is no tenderness. There is no rebound and no guarding.  Genitourinary:  Genital: external negative Vaginal: small amount white discharge. No Blood Cervix: long and closed Bimanual: negative    Musculoskeletal: Normal range of motion.  Neurological: She is alert and oriented to person, place, and time.  Skin: Skin is warm and dry.  Psychiatric: She has a normal mood and affect. Her behavior is normal. Judgment and thought content normal.   Results for orders placed during the hospital encounter of 01/16/14 (from the past 24 hour(s))  URINALYSIS, ROUTINE W REFLEX MICROSCOPIC     Status: Abnormal   Collection Time    01/16/14  7:35 AM      Result Value Range   Color, Urine YELLOW  YELLOW   APPearance CLEAR  CLEAR   Specific Gravity, Urine 1.020  1.005 - 1.030   pH 6.0  5.0 - 8.0   Glucose, UA NEGATIVE  NEGATIVE mg/dL   Hgb urine dipstick LARGE (*) NEGATIVE   Bilirubin Urine NEGATIVE  NEGATIVE   Ketones, ur 15 (*) NEGATIVE mg/dL   Protein, ur NEGATIVE  NEGATIVE mg/dL   Urobilinogen, UA 0.2  0.0 - 1.0 mg/dL   Nitrite NEGATIVE  NEGATIVE   Leukocytes, UA TRACE (*) NEGATIVE  URINE MICROSCOPIC-ADD ON     Status:  Abnormal   Collection Time    01/16/14  7:35 AM      Result Value Range   Squamous Epithelial / LPF FEW (*) RARE   WBC, UA 3-6  <3 WBC/hpf   RBC / HPF 11-20  <3 RBC/hpf   Urine-Other MUCOUS PRESENT       MAU Course  Procedures  MDM  Fern Negative FHR 150. Reassuring and no contractions  Assessment and Plan   A: UTI  P: Macrobid 100 mg bid x 7 days Lengthy discussion about increasing fluids Advised if any further issues develop to return Tylenol prn pain Requested note for work today   Carolynn Serve 01/16/2014, 9:18 AM

## 2014-01-16 NOTE — Discharge Instructions (Signed)
Urinary Tract Infection  Urinary tract infections (UTIs) can develop anywhere along your urinary tract. Your urinary tract is your body's drainage system for removing wastes and extra water. Your urinary tract includes two kidneys, two ureters, a bladder, and a urethra. Your kidneys are a pair of bean-shaped organs. Each kidney is about the size of your fist. They are located below your ribs, one on each side of your spine.  CAUSES  Infections are caused by microbes, which are microscopic organisms, including fungi, viruses, and bacteria. These organisms are so small that they can only be seen through a microscope. Bacteria are the microbes that most commonly cause UTIs.  SYMPTOMS   Symptoms of UTIs may vary by age and gender of the patient and by the location of the infection. Symptoms in young women typically include a frequent and intense urge to urinate and a painful, burning feeling in the bladder or urethra during urination. Older women and men are more likely to be tired, shaky, and weak and have muscle aches and abdominal pain. A fever may mean the infection is in your kidneys. Other symptoms of a kidney infection include pain in your back or sides below the ribs, nausea, and vomiting.  DIAGNOSIS  To diagnose a UTI, your caregiver will ask you about your symptoms. Your caregiver also will ask to provide a urine sample. The urine sample will be tested for bacteria and white blood cells. White blood cells are made by your body to help fight infection.  TREATMENT   Typically, UTIs can be treated with medication. Because most UTIs are caused by a bacterial infection, they usually can be treated with the use of antibiotics. The choice of antibiotic and length of treatment depend on your symptoms and the type of bacteria causing your infection.  HOME CARE INSTRUCTIONS   If you were prescribed antibiotics, take them exactly as your caregiver instructs you. Finish the medication even if you feel better after you  have only taken some of the medication.   Drink enough water and fluids to keep your urine clear or pale yellow.   Avoid caffeine, tea, and carbonated beverages. They tend to irritate your bladder.   Empty your bladder often. Avoid holding urine for long periods of time.   Empty your bladder before and after sexual intercourse.   After a bowel movement, women should cleanse from front to back. Use each tissue only once.  SEEK MEDICAL CARE IF:    You have back pain.   You develop a fever.   Your symptoms do not begin to resolve within 3 days.  SEEK IMMEDIATE MEDICAL CARE IF:    You have severe back pain or lower abdominal pain.   You develop chills.   You have nausea or vomiting.   You have continued burning or discomfort with urination.  MAKE SURE YOU:    Understand these instructions.   Will watch your condition.   Will get help right away if you are not doing well or get worse.  Document Released: 09/25/2005 Document Revised: 06/16/2012 Document Reviewed: 01/24/2012  [employer] Patient Information 2014 [employer].

## 2014-01-17 ENCOUNTER — Ambulatory Visit (INDEPENDENT_AMBULATORY_CARE_PROVIDER_SITE_OTHER): Payer: BC Managed Care – PPO | Admitting: Obstetrics and Gynecology

## 2014-01-17 ENCOUNTER — Encounter: Payer: Self-pay | Admitting: Obstetrics and Gynecology

## 2014-01-17 VITALS — BP 111/82 | Temp 97.0°F | Wt 231.2 lb

## 2014-01-17 DIAGNOSIS — O99210 Obesity complicating pregnancy, unspecified trimester: Secondary | ICD-10-CM

## 2014-01-17 DIAGNOSIS — B951 Streptococcus, group B, as the cause of diseases classified elsewhere: Secondary | ICD-10-CM

## 2014-01-17 DIAGNOSIS — IMO0002 Reserved for concepts with insufficient information to code with codable children: Secondary | ICD-10-CM

## 2014-01-17 DIAGNOSIS — N390 Urinary tract infection, site not specified: Secondary | ICD-10-CM

## 2014-01-17 DIAGNOSIS — O2390 Unspecified genitourinary tract infection in pregnancy, unspecified trimester: Secondary | ICD-10-CM

## 2014-01-17 DIAGNOSIS — Z3490 Encounter for supervision of normal pregnancy, unspecified, unspecified trimester: Secondary | ICD-10-CM

## 2014-01-17 DIAGNOSIS — O2340 Unspecified infection of urinary tract in pregnancy, unspecified trimester: Secondary | ICD-10-CM

## 2014-01-17 LAB — POCT URINALYSIS DIP (DEVICE)
BILIRUBIN URINE: NEGATIVE
GLUCOSE, UA: NEGATIVE mg/dL
Ketones, ur: NEGATIVE mg/dL
NITRITE: NEGATIVE
Protein, ur: NEGATIVE mg/dL
Specific Gravity, Urine: >=1.03 (ref 1.005–1.030)
Urobilinogen, UA: 0.2 mg/dL (ref 0.0–1.0)
pH: 6 (ref 5.0–8.0)

## 2014-01-17 NOTE — Progress Notes (Signed)
[id number] 

## 2014-01-17 NOTE — Progress Notes (Signed)
Patient is doing well without complaints. Was diagnosed with a UTI on 1/18 but did not pick up Rx yet. Advised to start treatment as soon as possible to prevent pyelo. Patient plans to breast feed and use NuvaRing for contraception. FM/PTL precautions reviewed.

## 2014-02-01 ENCOUNTER — Ambulatory Visit (INDEPENDENT_AMBULATORY_CARE_PROVIDER_SITE_OTHER): Payer: BC Managed Care – PPO | Admitting: Obstetrics and Gynecology

## 2014-02-01 ENCOUNTER — Encounter: Payer: Self-pay | Admitting: Obstetrics and Gynecology

## 2014-02-01 VITALS — BP 130/80 | Temp 98.5°F | Wt 238.0 lb

## 2014-02-01 DIAGNOSIS — O2390 Unspecified genitourinary tract infection in pregnancy, unspecified trimester: Secondary | ICD-10-CM

## 2014-02-01 DIAGNOSIS — B951 Streptococcus, group B, as the cause of diseases classified elsewhere: Secondary | ICD-10-CM

## 2014-02-01 DIAGNOSIS — N390 Urinary tract infection, site not specified: Secondary | ICD-10-CM

## 2014-02-01 DIAGNOSIS — O2340 Unspecified infection of urinary tract in pregnancy, unspecified trimester: Principal | ICD-10-CM

## 2014-02-01 LAB — POCT URINALYSIS DIP (DEVICE)
BILIRUBIN URINE: NEGATIVE
GLUCOSE, UA: NEGATIVE mg/dL
Hgb urine dipstick: NEGATIVE
KETONES UR: 40 mg/dL — AB
Nitrite: NEGATIVE
Protein, ur: 30 mg/dL — AB
Specific Gravity, Urine: >=1.03 (ref 1.005–1.030)
Urobilinogen, UA: 1 mg/dL (ref 0.0–1.0)
pH: 5.5 (ref 5.0–8.0)

## 2014-02-01 NOTE — Patient Instructions (Signed)
Third Trimester of Pregnancy  The third trimester is from week 29 through week 42, months 7 through 9. The third trimester is a time when the fetus is growing rapidly. At the end of the ninth month, the fetus is about 20 inches in length and weighs 6 10 pounds.   BODY CHANGES  Your body goes through many changes during pregnancy. The changes vary from woman to woman.    Your weight will continue to increase. You can expect to gain 25 35 pounds (11 16 kg) by the end of the pregnancy.   You may begin to get stretch marks on your hips, abdomen, and breasts.   You may urinate more often because the fetus is moving lower into your pelvis and pressing on your bladder.   You may develop or continue to have heartburn as a result of your pregnancy.   You may develop constipation because certain hormones are causing the muscles that push waste through your intestines to slow down.   You may develop hemorrhoids or swollen, bulging veins (varicose veins).   You may have pelvic pain because of the weight gain and pregnancy hormones relaxing your joints between the bones in your pelvis. Back aches may result from over exertion of the muscles supporting your posture.   Your breasts will continue to grow and be tender. A yellow discharge may leak from your breasts called colostrum.   Your belly button may stick out.   You may feel short of breath because of your expanding uterus.   You may notice the fetus "dropping," or moving lower in your abdomen.   You may have a bloody mucus discharge. This usually occurs a few days to a week before labor begins.   Your cervix becomes thin and soft (effaced) near your due date.  WHAT TO EXPECT AT YOUR PRENATAL EXAMS   You will have prenatal exams every 2 weeks until week 36. Then, you will have weekly prenatal exams. During a routine prenatal visit:   You will be weighed to make sure you and the fetus are growing normally.   Your blood pressure is taken.   Your abdomen will be  measured to track your baby's growth.   The fetal heartbeat will be listened to.   Any test results from the previous visit will be discussed.   You may have a cervical check near your due date to see if you have effaced.  At around 36 weeks, your caregiver will check your cervix. At the same time, your caregiver will also perform a test on the secretions of the vaginal tissue. This test is to determine if a type of bacteria, Group B streptococcus, is present. Your caregiver will explain this further.  Your caregiver may ask you:   What your birth plan is.   How you are feeling.   If you are feeling the baby move.   If you have had any abnormal symptoms, such as leaking fluid, bleeding, severe headaches, or abdominal cramping.   If you have any questions.  Other tests or screenings that may be performed during your third trimester include:   Blood tests that check for low iron levels (anemia).   Fetal testing to check the health, activity level, and growth of the fetus. Testing is done if you have certain medical conditions or if there are problems during the pregnancy.  FALSE LABOR  You may feel small, irregular contractions that eventually go away. These are called Braxton Hicks contractions, or   false labor. Contractions may last for hours, days, or even weeks before true labor sets in. If contractions come at regular intervals, intensify, or become painful, it is best to be seen by your caregiver.   SIGNS OF LABOR    Menstrual-like cramps.   Contractions that are 5 minutes apart or less.   Contractions that start on the top of the uterus and spread down to the lower abdomen and back.   A sense of increased pelvic pressure or back pain.   A watery or bloody mucus discharge that comes from the vagina.  If you have any of these signs before the 37th week of pregnancy, call your caregiver right away. You need to go to the hospital to get checked immediately.  HOME CARE INSTRUCTIONS    Avoid all  smoking, herbs, alcohol, and unprescribed drugs. These chemicals affect the formation and growth of the baby.   Follow your caregiver's instructions regarding medicine use. There are medicines that are either safe or unsafe to take during pregnancy.   Exercise only as directed by your caregiver. Experiencing uterine cramps is a good sign to stop exercising.   Continue to eat regular, healthy meals.   Wear a good support bra for breast tenderness.   Do not use hot tubs, steam rooms, or saunas.   Wear your seat belt at all times when driving.   Avoid raw meat, uncooked cheese, cat litter boxes, and soil used by cats. These carry germs that can cause birth defects in the baby.   Take your prenatal vitamins.   Try taking a stool softener (if your caregiver approves) if you develop constipation. Eat more high-fiber foods, such as fresh vegetables or fruit and whole grains. Drink plenty of fluids to keep your urine clear or pale yellow.   Take warm sitz baths to soothe any pain or discomfort caused by hemorrhoids. Use hemorrhoid cream if your caregiver approves.   If you develop varicose veins, wear support hose. Elevate your feet for 15 minutes, 3 4 times a day. Limit salt in your diet.   Avoid heavy lifting, wear low heal shoes, and practice good posture.   Rest a lot with your legs elevated if you have leg cramps or low back pain.   Visit your dentist if you have not gone during your pregnancy. Use a soft toothbrush to brush your teeth and be gentle when you floss.   A sexual relationship may be continued unless your caregiver directs you otherwise.   Do not travel far distances unless it is absolutely necessary and only with the approval of your caregiver.   Take prenatal classes to understand, practice, and ask questions about the labor and delivery.   Make a trial run to the hospital.   Pack your hospital bag.   Prepare the baby's nursery.   Continue to go to all your prenatal visits as directed  by your caregiver.  SEEK MEDICAL CARE IF:   You are unsure if you are in labor or if your water has broken.   You have dizziness.   You have mild pelvic cramps, pelvic pressure, or nagging pain in your abdominal area.   You have persistent nausea, vomiting, or diarrhea.   You have a bad smelling vaginal discharge.   You have pain with urination.  SEEK IMMEDIATE MEDICAL CARE IF:    You have a fever.   You are leaking fluid from your vagina.   You have spotting or bleeding from your vagina.     You have severe abdominal cramping or pain.   You have rapid weight loss or gain.   You have shortness of breath with chest pain.   You notice sudden or extreme swelling of your face, hands, ankles, feet, or legs.   You have not felt your baby move in over an hour.   You have severe headaches that do not go away with medicine.   You have vision changes.  Document Released: 12/10/2001 Document Revised: 08/18/2013 Document Reviewed: 02/16/2013  Exit[employer] Patient Information 2014 [employer].

## 2014-02-01 NOTE — Progress Notes (Signed)
P=  C/o of slight edema in right hand.

## 2014-02-01 NOTE — Progress Notes (Signed)
Doing well. 40 ket> advised increase hydration and not to skip meals. ? Mild CTS on right. Reviewed plans.

## 2014-02-06 ENCOUNTER — Inpatient Hospital Stay (HOSPITAL_COMMUNITY)
Admission: AD | Admit: 2014-02-06 | Discharge: 2014-02-06 | Disposition: A | Discharge status: Home / Self Care | Payer: BC Managed Care – PPO | Source: Ambulatory Visit | Attending: Obstetrics & Gynecology | Admitting: Obstetrics & Gynecology

## 2014-02-06 ENCOUNTER — Encounter (HOSPITAL_COMMUNITY): Payer: Self-pay | Admitting: *Deleted

## 2014-02-06 DIAGNOSIS — Z3490 Encounter for supervision of normal pregnancy, unspecified, unspecified trimester: Secondary | ICD-10-CM

## 2014-02-06 DIAGNOSIS — IMO0002 Reserved for concepts with insufficient information to code with codable children: Secondary | ICD-10-CM

## 2014-02-06 DIAGNOSIS — O9989 Other specified diseases and conditions complicating pregnancy, childbirth and the puerperium: Principal | ICD-10-CM

## 2014-02-06 DIAGNOSIS — B9789 Other viral agents as the cause of diseases classified elsewhere: Secondary | ICD-10-CM | POA: Insufficient documentation

## 2014-02-06 DIAGNOSIS — R109 Unspecified abdominal pain: Secondary | ICD-10-CM | POA: Insufficient documentation

## 2014-02-06 DIAGNOSIS — B349 Viral infection, unspecified: Secondary | ICD-10-CM

## 2014-02-06 DIAGNOSIS — O99210 Obesity complicating pregnancy, unspecified trimester: Secondary | ICD-10-CM

## 2014-02-06 DIAGNOSIS — M25559 Pain in unspecified hip: Secondary | ICD-10-CM | POA: Insufficient documentation

## 2014-02-06 DIAGNOSIS — O99891 Other specified diseases and conditions complicating pregnancy: Secondary | ICD-10-CM | POA: Insufficient documentation

## 2014-02-06 DIAGNOSIS — N949 Unspecified condition associated with female genital organs and menstrual cycle: Secondary | ICD-10-CM | POA: Insufficient documentation

## 2014-02-06 LAB — URINALYSIS, ROUTINE W REFLEX MICROSCOPIC
Bilirubin Urine: NEGATIVE
Glucose, UA: NEGATIVE mg/dL
Hgb urine dipstick: NEGATIVE
Ketones, ur: NEGATIVE mg/dL
Leukocytes, UA: NEGATIVE
NITRITE: NEGATIVE
PH: 6 (ref 5.0–8.0)
Protein, ur: NEGATIVE mg/dL
UROBILINOGEN UA: 0.2 mg/dL (ref 0.0–1.0)

## 2014-02-06 MED ORDER — LACTATED RINGERS IV BOLUS (SEPSIS)
1000.0000 mL | Freq: Once | INTRAVENOUS | Status: AC
  Administered 2014-02-06: 1000 mL via INTRAVENOUS

## 2014-02-06 MED ORDER — ACETAMINOPHEN 500 MG PO TABS
500.0000 mg | ORAL_TABLET | Freq: Once | ORAL | Status: AC
  Administered 2014-02-06: 500 mg via ORAL
  Filled 2014-02-06: qty 1

## 2014-02-06 NOTE — MAU Note (Signed)
Started having pain starting around Thursday.  Lower abdominal and vaginal pain.  Last intercourse 02/05/14. Denies vaginal bleeding, abnormal discharge

## 2014-02-06 NOTE — MAU Provider Note (Signed)
History     CSN: [id number]  Arrival date and time: 02/06/14 2101   First Provider Initiated Contact with Patient 02/06/14 2126      Chief Complaint  Patient presents with  . Urinary Tract Infection   HPI  Pt is a 23 yo G1P0000 at [redacted]w[redacted]d wks IUP here with report of lower pelvic pressure when needing to urinate that started two days ago.  Also states have groin and hip pain that hurts with walking or turning in bed.  Denies dysuria or hematuria.  No report of abnormal vaginal discharge, bleeding or leaking of fluid.  Pt also reports beginning not to "feel well", + headache and chills.     History reviewed. No pertinent past medical history.  Past Surgical History  Procedure Laterality Date  . Nose surgery      Family History  Problem Relation Age of Onset  . Sarcoidosis Mother   . Hypertension Father   . Diabetes Paternal Grandmother   . Hypertension Paternal Grandmother     History  Substance Use Topics  . Smoking status: Never Smoker   . Smokeless tobacco: Never Used  . Alcohol Use: No    Allergies: No Known Allergies  Prescriptions prior to admission  Medication Sig Dispense Refill  . Prenatal Vit-Fe Fumarate-FA (PRENATAL MULTIVITAMIN) TABS tablet Take 1 tablet by mouth daily at 12 noon.        Review of Systems  Constitutional: Positive for chills. Negative for fever.  HENT: Negative for sore throat.   Gastrointestinal: Positive for abdominal pain (lower pelvic pressure). Negative for nausea and vomiting.  Genitourinary: Negative for dysuria, urgency, hematuria and flank pain.  Neurological: Positive for headaches.  All other systems reviewed and are negative.   Physical Exam   Blood pressure 134/82, pulse 110, temperature 99.1 F (37.3 C), temperature source Oral, resp. rate 18, height 5\' 6"  (1.676 m), weight 108.41 kg (239 lb), last menstrual period 06/04/2013.  Physical Exam  Constitutional: She is oriented to person, place, and time. She appears  well-developed and well-nourished. No distress.  HENT:  Head: Normocephalic.  Neck: Normal range of motion. Neck supple.  Cardiovascular: Normal rate, regular rhythm and normal heart sounds.   Respiratory: Effort normal and breath sounds normal. No respiratory distress.  GI: Soft. There is no tenderness.  Genitourinary: No bleeding around the vagina.  Musculoskeletal: Normal range of motion. She exhibits edema (trace).  Neurological: She is alert and oriented to person, place, and time.  Skin: Skin is warm and dry.   Dilation: Closed Effacement (%): Thick Cervical Position: Posterior Exam by:: Roney Marion, CNM  MAU Course  Procedures Results for orders placed during the hospital encounter of 02/06/14 (from the past 24 hour(s))  URINALYSIS, ROUTINE W REFLEX MICROSCOPIC     Status: Abnormal   Collection Time    02/06/14  9:10 PM      Result Value Range   Color, Urine YELLOW  YELLOW   APPearance CLEAR  CLEAR   Specific Gravity, Urine <1.005 (*) 1.005 - 1.030   pH 6.0  5.0 - 8.0   Glucose, UA NEGATIVE  NEGATIVE mg/dL   Hgb urine dipstick NEGATIVE  NEGATIVE   Bilirubin Urine NEGATIVE  NEGATIVE   Ketones, ur NEGATIVE  NEGATIVE mg/dL   Protein, ur NEGATIVE  NEGATIVE mg/dL   Urobilinogen, UA 0.2  0.0 - 1.0 mg/dL   Nitrite NEGATIVE  NEGATIVE   Leukocytes, UA NEGATIVE  NEGATIVE   FHR 160's, +accels Toco - none  Given 1 liter of LR and 500 mg Tylenol  Assessment and Plan  22 yo G1P0000 at 3451w2d wks IUP Round Ligament Pain Viral Illness  Plan: Discharge to home Preterm labor precautions Increase fluids Tylenol for headache/fever Follow-up if no improvement or worsening of symptoms Keep scheduled appointment in office  [facility],WALIDAH 02/06/2014, 9:27 PM

## 2014-02-08 ENCOUNTER — Encounter (HOSPITAL_COMMUNITY): Payer: Self-pay | Admitting: *Deleted

## 2014-02-08 ENCOUNTER — Inpatient Hospital Stay (HOSPITAL_COMMUNITY)
Admission: AD | Admit: 2014-02-08 | Discharge: 2014-02-08 | Disposition: A | Discharge status: Home / Self Care | Payer: BC Managed Care – PPO | Source: Ambulatory Visit | Attending: Obstetrics & Gynecology | Admitting: Obstetrics & Gynecology

## 2014-02-08 DIAGNOSIS — O4700 False labor before 37 completed weeks of gestation, unspecified trimester: Secondary | ICD-10-CM | POA: Insufficient documentation

## 2014-02-08 DIAGNOSIS — R630 Anorexia: Secondary | ICD-10-CM | POA: Insufficient documentation

## 2014-02-08 DIAGNOSIS — R109 Unspecified abdominal pain: Secondary | ICD-10-CM | POA: Insufficient documentation

## 2014-02-08 DIAGNOSIS — R52 Pain, unspecified: Secondary | ICD-10-CM | POA: Insufficient documentation

## 2014-02-08 DIAGNOSIS — R51 Headache: Secondary | ICD-10-CM | POA: Insufficient documentation

## 2014-02-08 DIAGNOSIS — Z3490 Encounter for supervision of normal pregnancy, unspecified, unspecified trimester: Secondary | ICD-10-CM

## 2014-02-08 DIAGNOSIS — O99210 Obesity complicating pregnancy, unspecified trimester: Secondary | ICD-10-CM

## 2014-02-08 LAB — URINALYSIS, ROUTINE W REFLEX MICROSCOPIC
BILIRUBIN URINE: NEGATIVE
Glucose, UA: NEGATIVE mg/dL
Ketones, ur: NEGATIVE mg/dL
Leukocytes, UA: NEGATIVE
Nitrite: NEGATIVE
PROTEIN: NEGATIVE mg/dL
UROBILINOGEN UA: 0.2 mg/dL (ref 0.0–1.0)
pH: 5.5 (ref 5.0–8.0)

## 2014-02-08 LAB — URINE MICROSCOPIC-ADD ON

## 2014-02-08 NOTE — MAU Provider Note (Signed)
  History     CSN: [id number]  Arrival date and time: 02/08/14 [id number]   First Provider Initiated Contact with Patient 02/08/14 2059      Chief Complaint  Patient presents with  . Generalized Body Aches  . Headache   HPI  22 y.o. G1P0000 at 35.4 presents with three days of headache, decreased appetite, and lower abdominal pain which is unchanged over the past week and since her prior visit to MAU.  Patient states she came in today to make sure she did not have the flu, because she has never had it before. Headache resolved with tylenol. No nausea, vomiting, cough, or URI symptoms.  No muscle aches or body aches. Denies VB, LOF, contractions, cramping.  Reports good FM.   History reviewed. No pertinent past medical history.  Past Surgical History  Procedure Laterality Date  . Nose surgery      Family History  Problem Relation Age of Onset  . Sarcoidosis Mother   . Hypertension Father   . Diabetes Paternal Grandmother   . Hypertension Paternal Grandmother     History  Substance Use Topics  . Smoking status: Never Smoker   . Smokeless tobacco: Never Used  . Alcohol Use: No    Allergies: No Known Allergies  No prescriptions prior to admission    Review of Systems  Constitutional: Negative for fever and chills.  Eyes: Negative for double vision.       + blurry vision when not wearing glasses. No acute change  Respiratory: Negative for cough and shortness of breath.   Cardiovascular: Negative for chest pain.  Gastrointestinal: Negative for nausea, vomiting and diarrhea.  Genitourinary: Negative for dysuria.  Musculoskeletal: Negative for joint pain and myalgias.  Neurological: Negative for dizziness, tingling and headaches.   Physical Exam   Blood pressure 125/80, pulse 93, temperature 98.7 F (37.1 C), temperature source Oral, resp. rate 18, height 5\' 6"  (1.676 m), weight 107.049 kg (236 lb), last menstrual period 06/04/2013, SpO2 98.00%.  Physical Exam   Constitutional: She is oriented to person, place, and time. She appears well-developed and well-nourished.  HENT:  Head: Normocephalic and atraumatic.  Eyes: Conjunctivae are normal.  Cardiovascular: Normal rate, regular rhythm, normal heart sounds and intact distal pulses.   Respiratory: Effort normal and breath sounds normal. No respiratory distress.  GI: Soft.  Obese abd. Gravid uterus. Nontender  Musculoskeletal: She exhibits no edema and no tenderness.  Neurological: She is alert and oriented to person, place, and time.  No focal deficits  Skin: Skin is warm and dry.  Psychiatric: She has a normal mood and affect. Her behavior is normal. Thought content normal.    FWB: Cat1.  Baseline 150. Moderate Variability. Accels present. decels absent. 2 very mild UC's in 20 min  MAU Course  Procedures  FHT UA  Assessment and Plan   # Round Ligament pain - Recommend warm baths, warm compress, tylenol - Afebrile. No GI or GU symptoms. No s/s PTL. - FWB Cat 1  # Discharge home   Quincy SimmondsFeeney, Patricia L 02/08/2014, 9:20 PM   I was consulted RE: exam and POC and agree with above. FHR reviewed. Category IDorathy Kinsman.   Manav Pierotti, CNM 02/09/2014 3:40 AM

## 2014-02-08 NOTE — MAU Note (Signed)
Pt reports she feels like she is running a fever, body aches, headache. Symptoms x 3 days.

## 2014-02-09 NOTE — MAU Provider Note (Signed)
Attestation of Attending Supervision of Advanced Practitioner (CNM/NP): Evaluation and management procedures were performed by the Advanced Practitioner under my supervision and collaboration. I have reviewed the Advanced Practitioner's note and chart, and I agree with the management and plan.  LEGGETT,KELLY H. 6:56 AM   

## 2014-02-15 ENCOUNTER — Ambulatory Visit (INDEPENDENT_AMBULATORY_CARE_PROVIDER_SITE_OTHER): Payer: BC Managed Care – PPO | Admitting: Family

## 2014-02-15 VITALS — BP 122/75 | Temp 98.3°F | Wt 240.9 lb

## 2014-02-15 DIAGNOSIS — Z3490 Encounter for supervision of normal pregnancy, unspecified, unspecified trimester: Secondary | ICD-10-CM

## 2014-02-15 DIAGNOSIS — Z3480 Encounter for supervision of other normal pregnancy, unspecified trimester: Secondary | ICD-10-CM

## 2014-02-15 LAB — OB RESULTS CONSOLE GC/CHLAMYDIA
Chlamydia: NEGATIVE
GC PROBE AMP, GENITAL: NEGATIVE

## 2014-02-15 LAB — POCT URINALYSIS DIP (DEVICE)
BILIRUBIN URINE: NEGATIVE
Glucose, UA: NEGATIVE mg/dL
Hgb urine dipstick: NEGATIVE
LEUKOCYTES UA: NEGATIVE
Nitrite: NEGATIVE
PROTEIN: NEGATIVE mg/dL
SPECIFIC GRAVITY, URINE: 1.025 (ref 1.005–1.030)
Urobilinogen, UA: 0.2 mg/dL (ref 0.0–1.0)
pH: 6.5 (ref 5.0–8.0)

## 2014-02-15 NOTE — Progress Notes (Signed)
P = 98 

## 2014-02-15 NOTE — Progress Notes (Signed)
No questions or concerns.  Obtained GBS and GC/CT.  Reviewed signs of labor.

## 2014-02-16 LAB — GC/CHLAMYDIA PROBE AMP
CT Probe RNA: NEGATIVE
GC PROBE AMP APTIMA: NEGATIVE

## 2014-02-17 LAB — CULTURE, BETA STREP (GROUP B ONLY)

## 2014-02-23 ENCOUNTER — Ambulatory Visit (INDEPENDENT_AMBULATORY_CARE_PROVIDER_SITE_OTHER): Payer: BC Managed Care – PPO | Admitting: Obstetrics and Gynecology

## 2014-02-23 VITALS — BP 120/81 | Temp 97.7°F | Wt 240.3 lb

## 2014-02-23 DIAGNOSIS — E669 Obesity, unspecified: Secondary | ICD-10-CM

## 2014-02-23 DIAGNOSIS — O99210 Obesity complicating pregnancy, unspecified trimester: Secondary | ICD-10-CM

## 2014-02-23 DIAGNOSIS — O99213 Obesity complicating pregnancy, third trimester: Secondary | ICD-10-CM

## 2014-02-23 LAB — POCT URINALYSIS DIP (DEVICE)
GLUCOSE, UA: NEGATIVE mg/dL
Hgb urine dipstick: NEGATIVE
Nitrite: NEGATIVE
Protein, ur: NEGATIVE mg/dL
Urobilinogen, UA: 0.2 mg/dL (ref 0.0–1.0)
pH: 5.5 (ref 5.0–8.0)

## 2014-02-23 NOTE — Progress Notes (Signed)
Doing well. Explained PCN in labor for GBS carriage. Declines VE today. S/sx labor reviewed. [employer] student in Early Childhood Dev.

## 2014-02-23 NOTE — Patient Instructions (Signed)
Third Trimester of Pregnancy  The third trimester is from week 29 through week 42, months 7 through 9. The third trimester is a time when the fetus is growing rapidly. At the end of the ninth month, the fetus is about 20 inches in length and weighs 6 10 pounds.   BODY CHANGES  Your body goes through many changes during pregnancy. The changes vary from woman to woman.    Your weight will continue to increase. You can expect to gain 25 35 pounds (11 16 kg) by the end of the pregnancy.   You may begin to get stretch marks on your hips, abdomen, and breasts.   You may urinate more often because the fetus is moving lower into your pelvis and pressing on your bladder.   You may develop or continue to have heartburn as a result of your pregnancy.   You may develop constipation because certain hormones are causing the muscles that push waste through your intestines to slow down.   You may develop hemorrhoids or swollen, bulging veins (varicose veins).   You may have pelvic pain because of the weight gain and pregnancy hormones relaxing your joints between the bones in your pelvis. Back aches may result from over exertion of the muscles supporting your posture.   Your breasts will continue to grow and be tender. A yellow discharge may leak from your breasts called colostrum.   Your belly button may stick out.   You may feel short of breath because of your expanding uterus.   You may notice the fetus "dropping," or moving lower in your abdomen.   You may have a bloody mucus discharge. This usually occurs a few days to a week before labor begins.   Your cervix becomes thin and soft (effaced) near your due date.  WHAT TO EXPECT AT YOUR PRENATAL EXAMS   You will have prenatal exams every 2 weeks until week 36. Then, you will have weekly prenatal exams. During a routine prenatal visit:   You will be weighed to make sure you and the fetus are growing normally.   Your blood pressure is taken.   Your abdomen will be  measured to track your baby's growth.   The fetal heartbeat will be listened to.   Any test results from the previous visit will be discussed.   You may have a cervical check near your due date to see if you have effaced.  At around 36 weeks, your caregiver will check your cervix. At the same time, your caregiver will also perform a test on the secretions of the vaginal tissue. This test is to determine if a type of bacteria, Group B streptococcus, is present. Your caregiver will explain this further.  Your caregiver may ask you:   What your birth plan is.   How you are feeling.   If you are feeling the baby move.   If you have had any abnormal symptoms, such as leaking fluid, bleeding, severe headaches, or abdominal cramping.   If you have any questions.  Other tests or screenings that may be performed during your third trimester include:   Blood tests that check for low iron levels (anemia).   Fetal testing to check the health, activity level, and growth of the fetus. Testing is done if you have certain medical conditions or if there are problems during the pregnancy.  FALSE LABOR  You may feel small, irregular contractions that eventually go away. These are called Braxton Hicks contractions, or   false labor. Contractions may last for hours, days, or even weeks before true labor sets in. If contractions come at regular intervals, intensify, or become painful, it is best to be seen by your caregiver.   SIGNS OF LABOR    Menstrual-like cramps.   Contractions that are 5 minutes apart or less.   Contractions that start on the top of the uterus and spread down to the lower abdomen and back.   A sense of increased pelvic pressure or back pain.   A watery or bloody mucus discharge that comes from the vagina.  If you have any of these signs before the 37th week of pregnancy, call your caregiver right away. You need to go to the hospital to get checked immediately.  HOME CARE INSTRUCTIONS    Avoid all  smoking, herbs, alcohol, and unprescribed drugs. These chemicals affect the formation and growth of the baby.   Follow your caregiver's instructions regarding medicine use. There are medicines that are either safe or unsafe to take during pregnancy.   Exercise only as directed by your caregiver. Experiencing uterine cramps is a good sign to stop exercising.   Continue to eat regular, healthy meals.   Wear a good support bra for breast tenderness.   Do not use hot tubs, steam rooms, or saunas.   Wear your seat belt at all times when driving.   Avoid raw meat, uncooked cheese, cat litter boxes, and soil used by cats. These carry germs that can cause birth defects in the baby.   Take your prenatal vitamins.   Try taking a stool softener (if your caregiver approves) if you develop constipation. Eat more high-fiber foods, such as fresh vegetables or fruit and whole grains. Drink plenty of fluids to keep your urine clear or pale yellow.   Take warm sitz baths to soothe any pain or discomfort caused by hemorrhoids. Use hemorrhoid cream if your caregiver approves.   If you develop varicose veins, wear support hose. Elevate your feet for 15 minutes, 3 4 times a day. Limit salt in your diet.   Avoid heavy lifting, wear low heal shoes, and practice good posture.   Rest a lot with your legs elevated if you have leg cramps or low back pain.   Visit your dentist if you have not gone during your pregnancy. Use a soft toothbrush to brush your teeth and be gentle when you floss.   A sexual relationship may be continued unless your caregiver directs you otherwise.   Do not travel far distances unless it is absolutely necessary and only with the approval of your caregiver.   Take prenatal classes to understand, practice, and ask questions about the labor and delivery.   Make a trial run to the hospital.   Pack your hospital bag.   Prepare the baby's nursery.   Continue to go to all your prenatal visits as directed  by your caregiver.  SEEK MEDICAL CARE IF:   You are unsure if you are in labor or if your water has broken.   You have dizziness.   You have mild pelvic cramps, pelvic pressure, or nagging pain in your abdominal area.   You have persistent nausea, vomiting, or diarrhea.   You have a bad smelling vaginal discharge.   You have pain with urination.  SEEK IMMEDIATE MEDICAL CARE IF:    You have a fever.   You are leaking fluid from your vagina.   You have spotting or bleeding from your vagina.     You have severe abdominal cramping or pain.   You have rapid weight loss or gain.   You have shortness of breath with chest pain.   You notice sudden or extreme swelling of your face, hands, ankles, feet, or legs.   You have not felt your baby move in over an hour.   You have severe headaches that do not go away with medicine.   You have vision changes.  Document Released: 12/10/2001 Document Revised: 08/18/2013 Document Reviewed: 02/16/2013  Exit[employer] Patient Information 2014 [employer].

## 2014-02-23 NOTE — Progress Notes (Signed)
Pulse- 89 Patient reports "normal pain and pressure of pregnancy and braxton hicks"

## 2014-03-02 ENCOUNTER — Ambulatory Visit (INDEPENDENT_AMBULATORY_CARE_PROVIDER_SITE_OTHER): Payer: BC Managed Care – PPO | Admitting: Family Medicine

## 2014-03-02 ENCOUNTER — Encounter: Payer: Self-pay | Admitting: Family Medicine

## 2014-03-02 VITALS — BP 119/81 | Wt 245.3 lb

## 2014-03-02 DIAGNOSIS — Z3490 Encounter for supervision of normal pregnancy, unspecified, unspecified trimester: Secondary | ICD-10-CM

## 2014-03-02 DIAGNOSIS — Z3480 Encounter for supervision of other normal pregnancy, unspecified trimester: Secondary | ICD-10-CM

## 2014-03-02 DIAGNOSIS — O2390 Unspecified genitourinary tract infection in pregnancy, unspecified trimester: Secondary | ICD-10-CM

## 2014-03-02 DIAGNOSIS — N390 Urinary tract infection, site not specified: Secondary | ICD-10-CM

## 2014-03-02 DIAGNOSIS — O2340 Unspecified infection of urinary tract in pregnancy, unspecified trimester: Principal | ICD-10-CM

## 2014-03-02 DIAGNOSIS — B951 Streptococcus, group B, as the cause of diseases classified elsewhere: Secondary | ICD-10-CM

## 2014-03-02 DIAGNOSIS — IMO0002 Reserved for concepts with insufficient information to code with codable children: Secondary | ICD-10-CM

## 2014-03-02 DIAGNOSIS — O99210 Obesity complicating pregnancy, unspecified trimester: Secondary | ICD-10-CM

## 2014-03-02 LAB — POCT URINALYSIS DIP (DEVICE)
BILIRUBIN URINE: NEGATIVE
GLUCOSE, UA: NEGATIVE mg/dL
HGB URINE DIPSTICK: NEGATIVE
KETONES UR: 15 mg/dL — AB
Leukocytes, UA: NEGATIVE
Nitrite: NEGATIVE
PH: 6 (ref 5.0–8.0)
Protein, ur: NEGATIVE mg/dL
Urobilinogen, UA: 0.2 mg/dL (ref 0.0–1.0)

## 2014-03-02 NOTE — Progress Notes (Signed)
S: 23 yo G1 @ 4228w5d - here for ROBV - having irregular contractions - +FM. No vb, LOF.   O: see flowsheet  A/P - doing well - labor precautions discussed - f/u in 1 week.

## 2014-03-02 NOTE — Patient Instructions (Signed)
Third Trimester of Pregnancy  The third trimester is from week 29 through week 42, months 7 through 9. The third trimester is a time when the fetus is growing rapidly. At the end of the ninth month, the fetus is about 20 inches in length and weighs 6 10 pounds.   BODY CHANGES  Your body goes through many changes during pregnancy. The changes vary from woman to woman.    Your weight will continue to increase. You can expect to gain 25 35 pounds (11 16 kg) by the end of the pregnancy.   You may begin to get stretch marks on your hips, abdomen, and breasts.   You may urinate more often because the fetus is moving lower into your pelvis and pressing on your bladder.   You may develop or continue to have heartburn as a result of your pregnancy.   You may develop constipation because certain hormones are causing the muscles that push waste through your intestines to slow down.   You may develop hemorrhoids or swollen, bulging veins (varicose veins).   You may have pelvic pain because of the weight gain and pregnancy hormones relaxing your joints between the bones in your pelvis. Back aches may result from over exertion of the muscles supporting your posture.   Your breasts will continue to grow and be tender. A yellow discharge may leak from your breasts called colostrum.   Your belly button may stick out.   You may feel short of breath because of your expanding uterus.   You may notice the fetus "dropping," or moving lower in your abdomen.   You may have a bloody mucus discharge. This usually occurs a few days to a week before labor begins.   Your cervix becomes thin and soft (effaced) near your due date.  WHAT TO EXPECT AT YOUR PRENATAL EXAMS   You will have prenatal exams every 2 weeks until week 36. Then, you will have weekly prenatal exams. During a routine prenatal visit:   You will be weighed to make sure you and the fetus are growing normally.   Your blood pressure is taken.   Your abdomen will be  measured to track your baby's growth.   The fetal heartbeat will be listened to.   Any test results from the previous visit will be discussed.   You may have a cervical check near your due date to see if you have effaced.  At around 36 weeks, your caregiver will check your cervix. At the same time, your caregiver will also perform a test on the secretions of the vaginal tissue. This test is to determine if a type of bacteria, Group B streptococcus, is present. Your caregiver will explain this further.  Your caregiver may ask you:   What your birth plan is.   How you are feeling.   If you are feeling the baby move.   If you have had any abnormal symptoms, such as leaking fluid, bleeding, severe headaches, or abdominal cramping.   If you have any questions.  Other tests or screenings that may be performed during your third trimester include:   Blood tests that check for low iron levels (anemia).   Fetal testing to check the health, activity level, and growth of the fetus. Testing is done if you have certain medical conditions or if there are problems during the pregnancy.  FALSE LABOR  You may feel small, irregular contractions that eventually go away. These are called Braxton Hicks contractions, or   false labor. Contractions may last for hours, days, or even weeks before true labor sets in. If contractions come at regular intervals, intensify, or become painful, it is best to be seen by your caregiver.   SIGNS OF LABOR    Menstrual-like cramps.   Contractions that are 5 minutes apart or less.   Contractions that start on the top of the uterus and spread down to the lower abdomen and back.   A sense of increased pelvic pressure or back pain.   A watery or bloody mucus discharge that comes from the vagina.  If you have any of these signs before the 37th week of pregnancy, call your caregiver right away. You need to go to the hospital to get checked immediately.  HOME CARE INSTRUCTIONS    Avoid all  smoking, herbs, alcohol, and unprescribed drugs. These chemicals affect the formation and growth of the baby.   Follow your caregiver's instructions regarding medicine use. There are medicines that are either safe or unsafe to take during pregnancy.   Exercise only as directed by your caregiver. Experiencing uterine cramps is a good sign to stop exercising.   Continue to eat regular, healthy meals.   Wear a good support bra for breast tenderness.   Do not use hot tubs, steam rooms, or saunas.   Wear your seat belt at all times when driving.   Avoid raw meat, uncooked cheese, cat litter boxes, and soil used by cats. These carry germs that can cause birth defects in the baby.   Take your prenatal vitamins.   Try taking a stool softener (if your caregiver approves) if you develop constipation. Eat more high-fiber foods, such as fresh vegetables or fruit and whole grains. Drink plenty of fluids to keep your urine clear or pale yellow.   Take warm sitz baths to soothe any pain or discomfort caused by hemorrhoids. Use hemorrhoid cream if your caregiver approves.   If you develop varicose veins, wear support hose. Elevate your feet for 15 minutes, 3 4 times a day. Limit salt in your diet.   Avoid heavy lifting, wear low heal shoes, and practice good posture.   Rest a lot with your legs elevated if you have leg cramps or low back pain.   Visit your dentist if you have not gone during your pregnancy. Use a soft toothbrush to brush your teeth and be gentle when you floss.   A sexual relationship may be continued unless your caregiver directs you otherwise.   Do not travel far distances unless it is absolutely necessary and only with the approval of your caregiver.   Take prenatal classes to understand, practice, and ask questions about the labor and delivery.   Make a trial run to the hospital.   Pack your hospital bag.   Prepare the baby's nursery.   Continue to go to all your prenatal visits as directed  by your caregiver.  SEEK MEDICAL CARE IF:   You are unsure if you are in labor or if your water has broken.   You have dizziness.   You have mild pelvic cramps, pelvic pressure, or nagging pain in your abdominal area.   You have persistent nausea, vomiting, or diarrhea.   You have a bad smelling vaginal discharge.   You have pain with urination.  SEEK IMMEDIATE MEDICAL CARE IF:    You have a fever.   You are leaking fluid from your vagina.   You have spotting or bleeding from your vagina.     You have severe abdominal cramping or pain.   You have rapid weight loss or gain.   You have shortness of breath with chest pain.   You notice sudden or extreme swelling of your face, hands, ankles, feet, or legs.   You have not felt your baby move in over an hour.   You have severe headaches that do not go away with medicine.   You have vision changes.  Document Released: 12/10/2001 Document Revised: 08/18/2013 Document Reviewed: 02/16/2013  Exit[employer] Patient Information 2014 [employer].

## 2014-03-02 NOTE — Progress Notes (Signed)
P = 102   Pt reports irregular UC's and pelvic pressure.

## 2014-03-09 ENCOUNTER — Encounter (HOSPITAL_COMMUNITY): Payer: Self-pay

## 2014-03-09 ENCOUNTER — Inpatient Hospital Stay (HOSPITAL_COMMUNITY): Payer: BC Managed Care – PPO | Admitting: Anesthesiology

## 2014-03-09 ENCOUNTER — Inpatient Hospital Stay (HOSPITAL_COMMUNITY)
Admission: AD | Admit: 2014-03-09 | Discharge: 2014-03-11 | DRG: 775 | Disposition: A | Discharge status: Home / Self Care | Payer: BC Managed Care – PPO | Source: Ambulatory Visit | Attending: Obstetrics and Gynecology | Admitting: Obstetrics and Gynecology

## 2014-03-09 ENCOUNTER — Encounter (HOSPITAL_COMMUNITY): Payer: BC Managed Care – PPO | Admitting: Anesthesiology

## 2014-03-09 ENCOUNTER — Inpatient Hospital Stay (HOSPITAL_COMMUNITY): Payer: BC Managed Care – PPO

## 2014-03-09 DIAGNOSIS — O99892 Other specified diseases and conditions complicating childbirth: Secondary | ICD-10-CM | POA: Diagnosis present

## 2014-03-09 DIAGNOSIS — E669 Obesity, unspecified: Secondary | ICD-10-CM

## 2014-03-09 DIAGNOSIS — Z3490 Encounter for supervision of normal pregnancy, unspecified, unspecified trimester: Secondary | ICD-10-CM

## 2014-03-09 DIAGNOSIS — O9989 Other specified diseases and conditions complicating pregnancy, childbirth and the puerperium: Secondary | ICD-10-CM

## 2014-03-09 DIAGNOSIS — IMO0001 Reserved for inherently not codable concepts without codable children: Secondary | ICD-10-CM

## 2014-03-09 DIAGNOSIS — O99214 Obesity complicating childbirth: Secondary | ICD-10-CM

## 2014-03-09 DIAGNOSIS — Z22330 Carrier of Group B streptococcus: Secondary | ICD-10-CM

## 2014-03-09 DIAGNOSIS — K219 Gastro-esophageal reflux disease without esophagitis: Secondary | ICD-10-CM | POA: Diagnosis present

## 2014-03-09 HISTORY — DX: Other specified health status: Z78.9

## 2014-03-09 LAB — HEPATITIS B SURFACE ANTIGEN: Hepatitis B Surface Ag: NEGATIVE

## 2014-03-09 LAB — CBC
HCT: 34.6 % — ABNORMAL LOW (ref 36.0–46.0)
Hemoglobin: 11.8 g/dL — ABNORMAL LOW (ref 12.0–15.0)
MCH: 27.1 pg (ref 26.0–34.0)
MCHC: 34.1 g/dL (ref 30.0–36.0)
MCV: 79.4 fL (ref 78.0–100.0)
PLATELETS: 223 10*3/uL (ref 150–400)
RBC: 4.36 MIL/uL (ref 3.87–5.11)
RDW: 15.3 % (ref 11.5–15.5)
WBC: 7.6 10*3/uL (ref 4.0–10.5)

## 2014-03-09 LAB — ABO/RH: ABO/RH(D): O POS

## 2014-03-09 LAB — TYPE AND SCREEN
ABO/RH(D): O POS
Antibody Screen: NEGATIVE

## 2014-03-09 LAB — OB RESULTS CONSOLE GBS: GBS: POSITIVE

## 2014-03-09 LAB — RPR: RPR: NONREACTIVE

## 2014-03-09 MED ORDER — LACTATED RINGERS IV SOLN: 500.0000 mL | Freq: Once | INTRAVENOUS | Status: DC

## 2014-03-09 MED ORDER — OXYTOCIN 40 UNITS IN LACTATED RINGERS INFUSION - SIMPLE MED
1.0000 m[IU]/min | INTRAVENOUS | Status: DC
  Administered 2014-03-09: 2 m[IU]/min via INTRAVENOUS
  Filled 2014-03-09: qty 1000

## 2014-03-09 MED ORDER — TERBUTALINE SULFATE 1 MG/ML IJ SOLN: 0.2500 mg | Freq: Once | INTRAMUSCULAR | Status: DC | PRN

## 2014-03-09 MED ORDER — LIDOCAINE HCL (PF) 1 % IJ SOLN: 30.0000 mL | INTRAMUSCULAR | Status: DC | PRN

## 2014-03-09 MED ORDER — OXYTOCIN 40 UNITS IN LACTATED RINGERS INFUSION - SIMPLE MED: 62.5000 mL/h | INTRAVENOUS | Status: DC

## 2014-03-09 MED ORDER — IBUPROFEN 600 MG PO TABS
600.0000 mg | ORAL_TABLET | Freq: Four times a day (QID) | ORAL | Status: DC
  Administered 2014-03-09 – 2014-03-11 (×6): 600 mg via ORAL
  Filled 2014-03-09 (×7): qty 1

## 2014-03-09 MED ORDER — DIPHENHYDRAMINE HCL 50 MG/ML IJ SOLN: 12.5000 mg | INTRAMUSCULAR | Status: DC | PRN

## 2014-03-09 MED ORDER — EPHEDRINE 5 MG/ML INJ
10.0000 mg | INTRAVENOUS | Status: DC | PRN
  Filled 2014-03-09: qty 4

## 2014-03-09 MED ORDER — BENZOCAINE-MENTHOL 20-0.5 % EX AERO
1.0000 "application " | INHALATION_SPRAY | CUTANEOUS | Status: DC | PRN
  Filled 2014-03-09: qty 56

## 2014-03-09 MED ORDER — CITRIC ACID-SODIUM CITRATE 334-500 MG/5ML PO SOLN: 30.0000 mL | ORAL | Status: DC | PRN

## 2014-03-09 MED ORDER — ONDANSETRON HCL 4 MG/2ML IJ SOLN: 4.0000 mg | Freq: Four times a day (QID) | INTRAMUSCULAR | Status: DC | PRN

## 2014-03-09 MED ORDER — ONDANSETRON HCL 4 MG/2ML IJ SOLN: 4.0000 mg | INTRAMUSCULAR | Status: DC | PRN

## 2014-03-09 MED ORDER — ONDANSETRON HCL 4 MG PO TABS
4.0000 mg | ORAL_TABLET | ORAL | Status: DC | PRN
Start: 2014-03-09 — End: 2014-03-11

## 2014-03-09 MED ORDER — EPHEDRINE 5 MG/ML INJ: 10.0000 mg | INTRAVENOUS | Status: DC | PRN

## 2014-03-09 MED ORDER — PHENYLEPHRINE 40 MCG/ML (10ML) SYRINGE FOR IV PUSH (FOR BLOOD PRESSURE SUPPORT)
80.0000 ug | PREFILLED_SYRINGE | INTRAVENOUS | Status: DC | PRN
  Filled 2014-03-09: qty 10

## 2014-03-09 MED ORDER — PENICILLIN G POTASSIUM 5000000 UNITS IJ SOLR
5.0000 10*6.[IU] | Freq: Once | INTRAVENOUS | Status: AC
  Administered 2014-03-09: 5 10*6.[IU] via INTRAVENOUS
  Filled 2014-03-09: qty 5

## 2014-03-09 MED ORDER — OXYCODONE-ACETAMINOPHEN 5-325 MG PO TABS: 1.0000 | ORAL_TABLET | ORAL | Status: DC | PRN

## 2014-03-09 MED ORDER — ZOLPIDEM TARTRATE 5 MG PO TABS: 5.0000 mg | ORAL_TABLET | Freq: Every evening | ORAL | Status: DC | PRN

## 2014-03-09 MED ORDER — LIDOCAINE HCL (PF) 1 % IJ SOLN
30.0000 mL | INTRAMUSCULAR | Status: DC | PRN
  Filled 2014-03-09: qty 30

## 2014-03-09 MED ORDER — TETANUS-DIPHTH-ACELL PERTUSSIS 5-2.5-18.5 LF-MCG/0.5 IM SUSP: 0.5000 mL | Freq: Once | INTRAMUSCULAR | Status: DC

## 2014-03-09 MED ORDER — IBUPROFEN 600 MG PO TABS: 600.0000 mg | ORAL_TABLET | Freq: Four times a day (QID) | ORAL | Status: DC | PRN

## 2014-03-09 MED ORDER — PRENATAL MULTIVITAMIN CH
1.0000 | ORAL_TABLET | Freq: Every day | ORAL | Status: DC
  Administered 2014-03-10: 1 via ORAL
  Filled 2014-03-09: qty 1

## 2014-03-09 MED ORDER — SENNOSIDES-DOCUSATE SODIUM 8.6-50 MG PO TABS
2.0000 | ORAL_TABLET | ORAL | Status: DC
  Administered 2014-03-09 – 2014-03-10 (×2): 2 via ORAL
  Filled 2014-03-09 (×2): qty 2

## 2014-03-09 MED ORDER — OXYTOCIN 40 UNITS IN LACTATED RINGERS INFUSION - SIMPLE MED
62.5000 mL/h | INTRAVENOUS | Status: DC
  Administered 2014-03-09: 62.5 mL/h via INTRAVENOUS

## 2014-03-09 MED ORDER — LACTATED RINGERS IV SOLN
INTRAVENOUS | Status: DC
  Administered 2014-03-09: 11:00:00 via INTRAVENOUS

## 2014-03-09 MED ORDER — MEASLES, MUMPS & RUBELLA VAC ~~LOC~~ INJ
0.5000 mL | INJECTION | Freq: Once | SUBCUTANEOUS | Status: DC
  Filled 2014-03-09: qty 0.5

## 2014-03-09 MED ORDER — PENICILLIN G POTASSIUM 5000000 UNITS IJ SOLR
2.5000 10*6.[IU] | INTRAVENOUS | Status: DC
  Administered 2014-03-09: 2.5 10*6.[IU] via INTRAVENOUS
  Filled 2014-03-09 (×4): qty 2.5

## 2014-03-09 MED ORDER — SIMETHICONE 80 MG PO CHEW: 80.0000 mg | CHEWABLE_TABLET | ORAL | Status: DC | PRN

## 2014-03-09 MED ORDER — DIBUCAINE 1 % RE OINT: 1.0000 "application " | TOPICAL_OINTMENT | RECTAL | Status: DC | PRN

## 2014-03-09 MED ORDER — DIPHENHYDRAMINE HCL 25 MG PO CAPS: 25.0000 mg | ORAL_CAPSULE | Freq: Four times a day (QID) | ORAL | Status: DC | PRN

## 2014-03-09 MED ORDER — LACTATED RINGERS IV SOLN: 500.0000 mL | INTRAVENOUS | Status: DC | PRN

## 2014-03-09 MED ORDER — LACTATED RINGERS IV SOLN: INTRAVENOUS | Status: DC

## 2014-03-09 MED ORDER — ACETAMINOPHEN 325 MG PO TABS
650.0000 mg | ORAL_TABLET | ORAL | Status: DC | PRN
Start: 2014-03-09 — End: 2014-03-11

## 2014-03-09 MED ORDER — PHENYLEPHRINE 40 MCG/ML (10ML) SYRINGE FOR IV PUSH (FOR BLOOD PRESSURE SUPPORT): 80.0000 ug | PREFILLED_SYRINGE | INTRAVENOUS | Status: DC | PRN

## 2014-03-09 MED ORDER — OXYTOCIN BOLUS FROM INFUSION
500.0000 mL | INTRAVENOUS | Status: DC
  Administered 2014-03-09: 500 mL via INTRAVENOUS

## 2014-03-09 MED ORDER — ACETAMINOPHEN 325 MG PO TABS: 650.0000 mg | ORAL_TABLET | ORAL | Status: DC | PRN

## 2014-03-09 MED ORDER — WITCH HAZEL-GLYCERIN EX PADS
1.0000 | MEDICATED_PAD | CUTANEOUS | Status: DC | PRN
Start: 2014-03-09 — End: 2014-03-11

## 2014-03-09 MED ORDER — LANOLIN HYDROUS EX OINT: TOPICAL_OINTMENT | CUTANEOUS | Status: DC | PRN

## 2014-03-09 MED ORDER — FENTANYL 2.5 MCG/ML BUPIVACAINE 1/10 % EPIDURAL INFUSION (WH - ANES)
14.0000 mL/h | INTRAMUSCULAR | Status: DC | PRN
  Administered 2014-03-09: 14 mL/h via EPIDURAL
  Filled 2014-03-09: qty 125

## 2014-03-09 MED ORDER — OXYTOCIN BOLUS FROM INFUSION: 500.0000 mL | INTRAVENOUS | Status: DC

## 2014-03-09 MED ORDER — LIDOCAINE HCL (PF) 1 % IJ SOLN
INTRAMUSCULAR | Status: DC | PRN
  Administered 2014-03-09 (×4): 4 mL

## 2014-03-09 NOTE — H&P (Signed)
Alexis Lambert is a 23 y.o. female presenting for SOL. Ctx starting 10PM last night. Increasing in intensity. Lost mucus plug with small amount of bleeding. Otherwise, denies bleeding. +FM.  History OB History   Grav Para Term Preterm Abortions TAB SAB Ect Mult Living   1 0 0 0 0 0 0 0 0 0      Past Medical History  Diagnosis Date  . Medical history non-contributory    Past Surgical History  Procedure Laterality Date  . Nose surgery     Family History: family history includes Diabetes in her paternal grandmother; Hypertension in her father and paternal grandmother; Sarcoidosis in her mother. Social History:  reports that she has never smoked. She has never used smokeless tobacco. She reports that she does not drink alcohol or use illicit drugs.   Prenatal Transfer Tool  Maternal Diabetes: No Genetic Screening: Normal Maternal Ultrasounds/Referrals: Normal Fetal Ultrasounds or other Referrals:  None Maternal Substance Abuse:  No Significant Maternal Medications:  None Significant Maternal Lab Results:  Lab values include: Group B Strep positive Other Comments:  None  ROS See HPI Dilation: 4 Effacement (%): 80 Station: -2 Exam by:: Hall/J.Cox, RN Blood pressure 130/68, pulse 59, temperature 98.2 F (36.8 C), temperature source Oral, resp. rate 18, height 5\' 6"  (1.676 m), weight 113.581 kg (250 lb 6.4 oz), last menstrual period 06/04/2013. Maternal Exam:  Uterine Assessment: Contraction strength is mild.  Contraction frequency is irregular.   Cervix: Cervix evaluated by digital exam.     Fetal Exam Fetal Monitor Review: Mode: ultrasound.   Baseline rate: 130.  Variability: moderate (6-25 bpm).   Pattern: accelerations present and no decelerations.    Fetal State Assessment: Category I - tracings are normal.     Physical Exam  Constitutional: She is oriented to person, place, and time. She appears well-developed and well-nourished. No distress.  Cardiovascular: Normal  rate and regular rhythm.   Respiratory: Effort normal and breath sounds normal. No respiratory distress.  GI: She exhibits no mass. There is no tenderness. There is no rebound and no guarding.  Neurological: She is alert and oriented to person, place, and time. She has normal reflexes.  Skin: Skin is warm and dry.  Psychiatric: She has a normal mood and affect. Her behavior is normal.    Prenatal labs: ABO, Rh: --/--/O POS, O POS (03/11 1105) Antibody: NEG (03/11 1105) Rubella:  Immune RPR: NON REAC (12/22 1127)  HBsAg:   NR HIV: NON REACTIVE (12/22 1127)  GBS: Positive (03/11 0000)   Assessment/Plan: 23 y.o. G1P0000 @ 5359w5d presents for SOL.   #Labor: Early labor; will augment with pitocin if unchanged. Contractions infrequent and irregular.  #Pain: fentanyl PRN; epidural  #FWB; Category 1 #ID:  GBS +; PCN #Feeding: Breast  #MOC: Nuvaring; discussed LARC, will consider # FOB: Johnny Baldemar LenisSmith     Hall, Jonathan C 03/09/2014, 2:17 PM  I have seen and examined this patient and agree with above documentation in the resident's note.   Rulon AbideKeli Malva Diesing, M.D. [facility] 03/09/2014 6:35 PM

## 2014-03-09 NOTE — MAU Note (Signed)
Contractions every 15 minutes since 2 am this morning. Lost mucous plug. Denies LOF or vaginal bleeding. Positive fetal movement.

## 2014-03-09 NOTE — MAU Provider Note (Signed)
Received report on this RN Labor eval patient.  This is a 23 y.o. female at 7166w5d who presented for labor eval.   FHR pattern was equivocal so previous provider ordered a BPP which was 8/8 and AFI was 5.7cm  FHR pattern reassuring but equivocal  Cervix changed from 3.5 to 4cm  Discussed with Dr Debroah LoopArnold  >>  Admit

## 2014-03-09 NOTE — Anesthesia Procedure Notes (Signed)
Epidural Patient location during procedure: OB Start time: 03/09/2014 3:26 PM  Staffing Performed by: anesthesiologist   Preanesthetic Checklist Completed: patient identified, site marked, surgical consent, pre-op evaluation, timeout performed, IV checked, risks and benefits discussed and monitors and equipment checked  Epidural Patient position: sitting Prep: site prepped and draped and DuraPrep Patient monitoring: continuous pulse ox and blood pressure Approach: midline Injection technique: LOR air  Needle:  Needle type: Tuohy  Needle gauge: 17 G Needle length: 9 cm and 9 Needle insertion depth: 7 cm Catheter type: closed end flexible Catheter size: 19 Gauge Catheter at skin depth: 12 cm Test dose: negative  Assessment Events: blood not aspirated, injection not painful, no injection resistance, negative IV test and no paresthesia  Additional Notes Discussed risk of headache, infection, bleeding, nerve injury and failed or incomplete block.  Patient voices understanding and wishes to proceed.  Epidural placed easily on first attempt. No paresthesia. Patient tolerated procedure well with no apparent complications.  Jasmine DecemberA. Geramy Lamorte, MDReason for block:procedure for pain

## 2014-03-09 NOTE — Anesthesia Preprocedure Evaluation (Signed)
Anesthesia Evaluation  Patient identified by MRN, date of birth, ID band Patient awake    Reviewed: Allergy & Precautions, H&P , NPO status , Patient's Chart, lab work & pertinent test results, reviewed documented beta blocker date and time   History of Anesthesia Complications Negative for: history of anesthetic complications  Airway Mallampati: II TM Distance: >3 FB Neck ROM: full    Dental  (+) Teeth Intact   Pulmonary neg pulmonary ROS,  breath sounds clear to auscultation        Cardiovascular negative cardio ROS  Rhythm:regular Rate:Normal     Neuro/Psych negative neurological ROS  negative psych ROS   GI/Hepatic Neg liver ROS, GERD-  ,  Endo/Other  Morbid obesity  Renal/GU negative Renal ROS     Musculoskeletal   Abdominal   Peds  Hematology negative hematology ROS (+)   Anesthesia Other Findings   Reproductive/Obstetrics (+) Pregnancy                           Anesthesia Physical Anesthesia Plan  ASA: III  Anesthesia Plan: Epidural   Post-op Pain Management:    Induction:   Airway Management Planned:   Additional Equipment:   Intra-op Plan:   Post-operative Plan:   Informed Consent: I have reviewed the patients History and Physical, chart, labs and discussed the procedure including the risks, benefits and alternatives for the proposed anesthesia with the patient or authorized representative who has indicated his/her understanding and acceptance.     Plan Discussed with:   Anesthesia Plan Comments:         Anesthesia Quick Evaluation

## 2014-03-10 ENCOUNTER — Encounter: Payer: Self-pay | Admitting: Family Medicine

## 2014-03-10 ENCOUNTER — Encounter: Payer: BC Managed Care – PPO | Admitting: Obstetrics & Gynecology

## 2014-03-10 LAB — RUBELLA SCREEN: Rubella: 2.25 Index — ABNORMAL HIGH (ref <0.90)

## 2014-03-10 LAB — RPR: RPR: NONREACTIVE

## 2014-03-10 NOTE — Progress Notes (Addendum)
Post Partum Day 1 Subjective: no complaints, up ad lib, voiding and tolerating PO  Objective: Blood pressure 128/74, pulse 80, temperature 98.6 F (37 C), temperature source Oral, resp. rate 16, height 5\' 6"  (1.676 m), weight 113.581 kg (250 lb 6.4 oz), last menstrual period 06/04/2013, SpO2 100.00%, unknown if currently breastfeeding.  Physical Exam:  General: alert, cooperative and no distress Lochia: appropriate Uterine Fundus: firm Incision: na DVT Evaluation: No evidence of DVT seen on physical exam. Negative Homan's sign. No cords or calf tenderness. No significant calf/ankle edema.   Recent Labs  03/09/14 1105  HGB 11.8*  HCT 34.6*    Assessment/Plan: Plan for discharge tomorrow, Breastfeeding, Lactation consult and Contraception Nuvaring; Discussed/Declined LARC options   LOS: 1 day   Wenda LowJoyner, James 03/10/2014, 7:43 AM   I spoke with and examined patient and agree with resident's note and plan of care.  Tawana ScaleMichael Ryan Gracious Renken, MD OB Fellow 03/10/2014 8:04 AM

## 2014-03-10 NOTE — Lactation Note (Signed)
This note was copied from the chart of Girl Naelle Dettmer. Lactation Consultation Note  Patient Name: Girl Trixie Dredgendee Summerfield UJWJX'BToday's Date: 03/10/2014 Reason for consult: Initial assessment Baby awake and crying , stool diaper changed and meconium saved. Baby awake and rooting, baby latched with depth and consistent pattern with 7-8 swallows for 5 mins and released  After releasing did not seem interested and no further feeding cues noted. Reviewed basics with mom, mom able to return demo of hand expressing, several  drops of colostrum noted. Mom aware of  The [facility] and [facility] , pamphlet given.    Maternal Data Formula Feeding for Exclusion: No Infant to breast within first hour of birth: Yes (attempted ) Has patient been taught Hand Expression?: Yes Does the patient have breastfeeding experience prior to this delivery?: No  Feeding Feeding Type: Breast Fed Length of feed: 5 min (consistent pattern, with 7-8 swallows )  LATCH Score/Interventions Latch: Grasps breast easily, tongue down, lips flanged, rhythmical sucking. Intervention(s): Adjust position;Assist with latch;Breast massage;Breast compression  Audible Swallowing: Spontaneous and intermittent  Type of Nipple: Everted at rest and after stimulation  Comfort (Breast/Nipple): Soft / non-tender     Hold (Positioning): Assistance needed to correctly position infant at breast and maintain latch. Intervention(s): Breastfeeding basics reviewed;Support Pillows;Position options;Skin to skin  LATCH Score: 9  Lactation Tools Discussed/Used     Consult Status Consult Status: Follow-up Date: 03/11/14 Follow-up type: In-patient    Kathrin Greathouseorio, Hannah Strader Ann 03/10/2014, 10:45 AM

## 2014-03-10 NOTE — Anesthesia Postprocedure Evaluation (Signed)
  Anesthesia Post-op Note  Patient: Alexis Lambert  Procedure(s) Performed: * No procedures listed *  Patient Location: Mother/Baby  Anesthesia Type:Epidural  Level of Consciousness: awake  Airway and Oxygen Therapy: Patient Spontanous Breathing  Post-op Pain: mild  Post-op Assessment: Patient's Cardiovascular Status Stable and Respiratory Function Stable  Post-op Vital Signs: stable  Complications: No apparent anesthesia complications

## 2014-03-11 MED ORDER — IBUPROFEN 600 MG PO TABS: 600.0000 mg | ORAL_TABLET | Freq: Four times a day (QID) | ORAL | Status: AC

## 2014-03-11 MED ORDER — NORETHINDRONE 0.35 MG PO TABS: 1.0000 | ORAL_TABLET | Freq: Every day | ORAL | Status: DC

## 2014-03-11 NOTE — Lactation Note (Signed)
This note was copied from the chart of Alexis Lambert. Lactation Consultation Note  Patient Name: Alexis Trixie Dredgendee Loder [id number]'s Date: 03/11/2014 Reason for consult: Follow-up assessment Per mom nipples are feeling tender today,and breast are getting full , LC assessed no breakdown noted , Breast are fuller today. Baby presently feeding, LV assisted with extra pillow support under baby and support for baby's head. Multiply swallows noted , baby ended up feeding 25 mins, nipple appeared normal shape when baby released.  Baby still showing feeding cues , assisted mom with positioning and mom latched the baby on the left breast , LC only assisted  To flip upper lip open and ease chin to obtain depth. Per mom more comfortable. Discussed sore nipple and engorgement prevention and tx. Instructed on use comfort gels , reviewed hand pump and increased flanges to #27. Per mom will be receiving a DEBP from insurance company in the next  Few days. Mentioned to mom she can call to rent if challenges with engorgement occur. Also aware of the [facility] and [facility] services.   Maternal Data    Feeding Feeding Type: Breast Fed (switched breast ) Length of feed: 25 min (baby already latched , consistent pattern multiply swallows )  LATCH Score/Interventions Latch: Grasps breast easily, tongue down, lips flanged, rhythmical sucking. Intervention(s): Skin to skin;Teach feeding cues;Waking techniques Intervention(s): Adjust position;Assist with latch;Breast massage;Breast compression  Audible Swallowing: Spontaneous and intermittent  Type of Nipple: Everted at rest and after stimulation  Comfort (Breast/Nipple): Soft / non-tender     Hold (Positioning): Assistance needed to correctly position infant at breast and maintain latch. (worked on depth and flipping upper lips to flanged position ) Intervention(s): Breastfeeding basics reviewed;Support Pillows;Position options;Skin to skin  LATCH Score:  9  Lactation Tools Discussed/Used Tools: Pump;Flanges (already at bedside , reveiwed and checked flange ) Flange Size: 27 Breast pump type: Manual Pump Review:  (already reviewed on teaching sheet and checked off )   Consult Status Consult Status: Complete Date: 03/11/14 Follow-up type: In-patient    Kathrin Greathouseorio, Rylen Hou Ann 03/11/2014, 9:39 AM

## 2014-03-11 NOTE — Progress Notes (Signed)
CSW referral received to assess reason for LPNC @ 28 weeks however pt states she started PNC @ 8 weeks. Chart review indicates that pt received care at [facility] until transferred to the area @ 28 weeks. CSW informed the pt that a drug screen was done on infant but did not complete a full assessment. UDS is negative, meconium results are pending. CSW will continue to monitor drug screen results & make a referral if necessary. Parents were understanding.      

## 2014-03-11 NOTE — Discharge Instructions (Signed)

## 2014-03-17 NOTE — Discharge Summary (Signed)
Obstetric Discharge Summary Reason for Admission: onset of labor Prenatal Procedures: NST Intrapartum Procedures: spontaneous vaginal delivery Postpartum Procedures: none Complications-Operative and Postpartum: labial tear Hemoglobin  Date Value Ref Range Status  03/09/2014 11.8* 12.0 - 15.0 g/dL Final     HCT  Date Value Ref Range Status  03/09/2014 34.6* 36.0 - 46.0 % Final    Physical Exam:  General: alert, cooperative and no distress Lochia: appropriate Uterine Fundus: firm Incision: na DVT Evaluation: No evidence of DVT seen on physical exam. No cords or calf tenderness. No significant calf/ankle edema.  Discharge Diagnoses: Term Pregnancy-delivered  Discharge Information: Date: 03/17/2014 Activity: pelvic rest Diet: routine Medications: PNV and Ibuprofen Condition: stable Instructions: refer to practice specific booklet Discharge to: home Follow-up Information   [facility] Schedule an appointment as soon as possible for a visit in 6 weeks.   Specialty:  Obstetrics and Gynecology   Contact information:   [address] [place] [address] [phone]      Newborn Data: Live born female  Birth Weight: 5 lb 14 oz (2665 g) APGAR: 9, 9  Home with mother.  Pt presented in active labor and progressed to deliver a liveborn female via NSVD. Labial tear repaired for hemostasis.  No complications postpartum.  She is breast feeding and planning on nuvaring for contraception.  Plan to start at 6 week pp check.    Alexis Lambert L 03/17/2014, 3:12 PM

## 2014-04-08 ENCOUNTER — Ambulatory Visit (INDEPENDENT_AMBULATORY_CARE_PROVIDER_SITE_OTHER): Payer: BC Managed Care – PPO | Admitting: Family Medicine

## 2014-04-08 ENCOUNTER — Encounter: Payer: Self-pay | Admitting: Family Medicine

## 2014-04-08 MED ORDER — ETONOGESTREL-ETHINYL ESTRADIOL 0.12-0.015 MG/24HR VA RING: VAGINAL_RING | VAGINAL | Status: AC

## 2014-04-08 NOTE — Progress Notes (Signed)
Desires Nuvaring

## 2014-04-08 NOTE — Patient Instructions (Signed)

## 2014-04-08 NOTE — Progress Notes (Signed)
Patient ID: Alexis Lambert, female   DOB: 03/18/1991, 22 y.o.   MRN: [id number] Subjective:    Alexis Lambert is a 23 y.o. 371P1001 African American female who presents for a postpartum visit. She is 5 weeks postpartum following a spontaneous vaginal delivery. I have fully reviewed the prenatal and intrapartum course. The delivery was at 39w5 gestational weeks. Outcome: spontaneous vaginal delivery. Anesthesia: epidural. Postpartum course has been uncomplciated. Baby's course has been good. Baby is feeding by breast. Bleeding staining only. Bowel function is normal. Bladder function is normal. Patient is not sexually active. Contraception method is NuvaRing vaginal inserts. Postpartum depression screening: negative.  The following portions of the patient's history were reviewed and updated as appropriate: allergies, current medications, past medical history, past surgical history and problem list.  Review of Systems Pertinent items are noted in HPI.   Filed Vitals:   04/08/14 1043 04/08/14 1045  BP: 123/90 110/77  Pulse: 75   Temp: 97.3 F (36.3 C)   Weight: 239 lb 11.2 oz (108.727 kg)     Objective:     General:  alert, cooperative and no distress   Breasts:  deferred, no complaints  Lungs: clear to auscultation bilaterally  Heart:  regular rate and rhythm  Abdomen: soft, nontender   Vulva: normal  Vagina: normal vagina  Cervix:  closed  Corpus: Well-involuted  Adnexa:  Non-palpable  Rectal Exam: no hemorrhoids        Assessment:   normal postpartum exam 5 wks s/p NSVD Depression screening Contraception counseling   Plan:   Contraception: NuvaRing vaginal inserts. Discussed insertion and handout given.   Doing well Well healed sulcal and labial tear.  Follow up in: 1 year for routine gyn visit or as needed.

## 2014-05-12 IMAGING — US US OB COMP +14 WK
1 series · 12 of 28 positions shown · non-contrast
Comparison: none

[Series 1: us ob comp +14 wk · 12 of 118 slices shown]
[im 5/118]
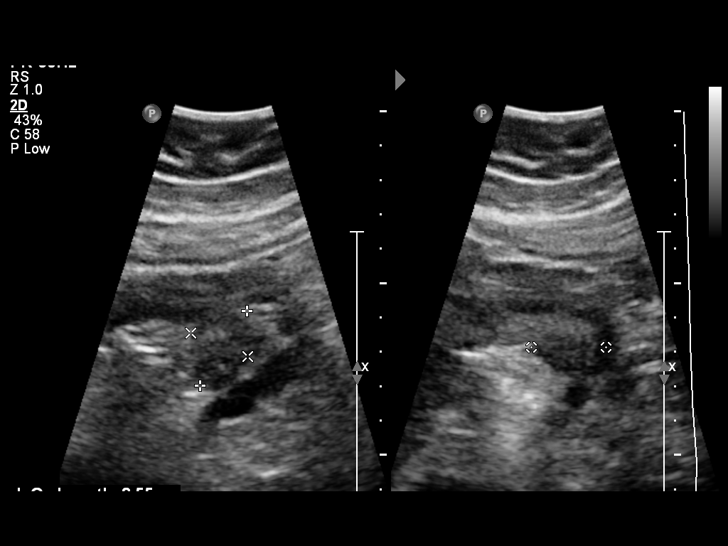
[im 14/118]
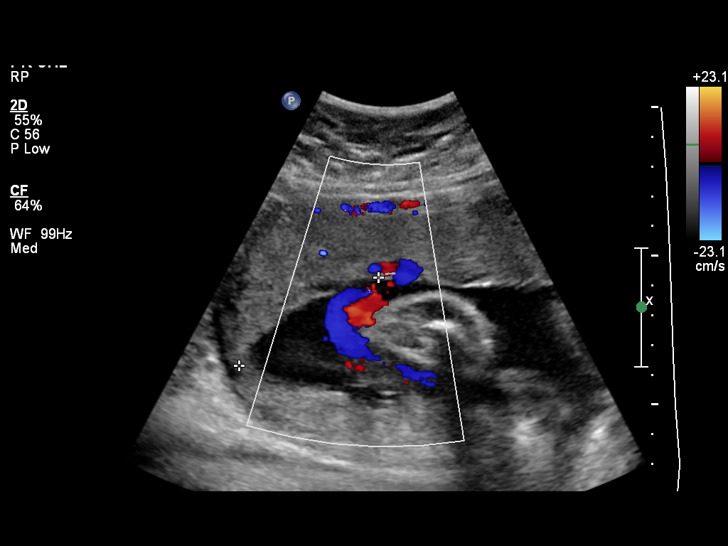
[im 22/118]
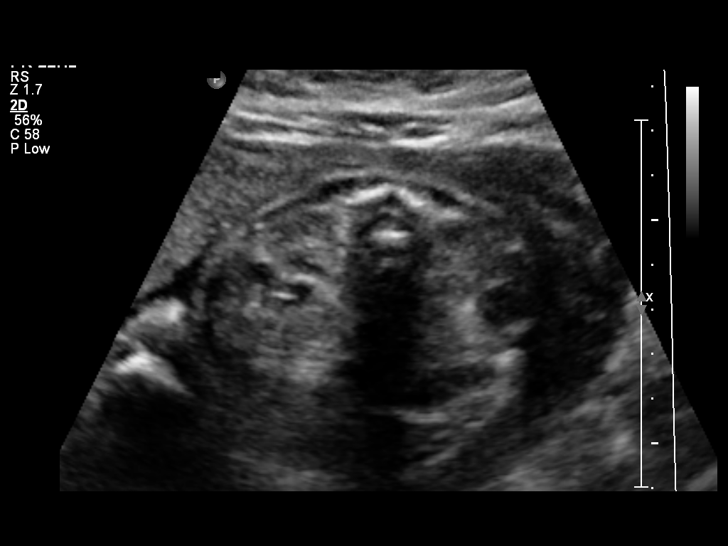
[im 35/118]
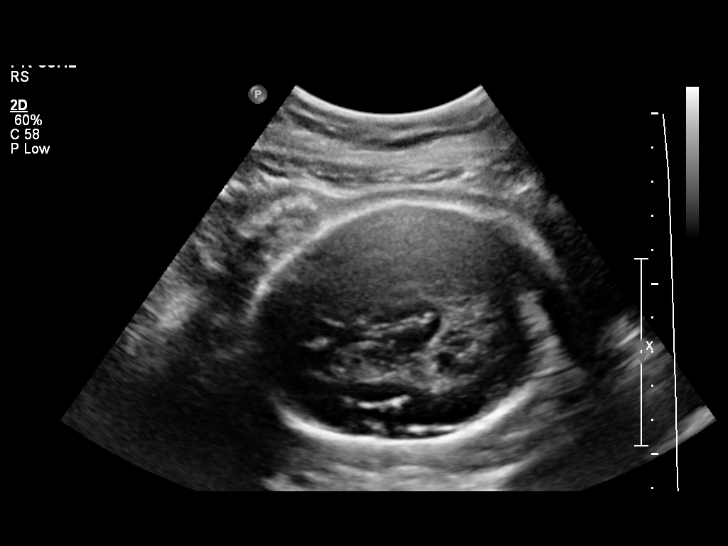
[im 44/118]
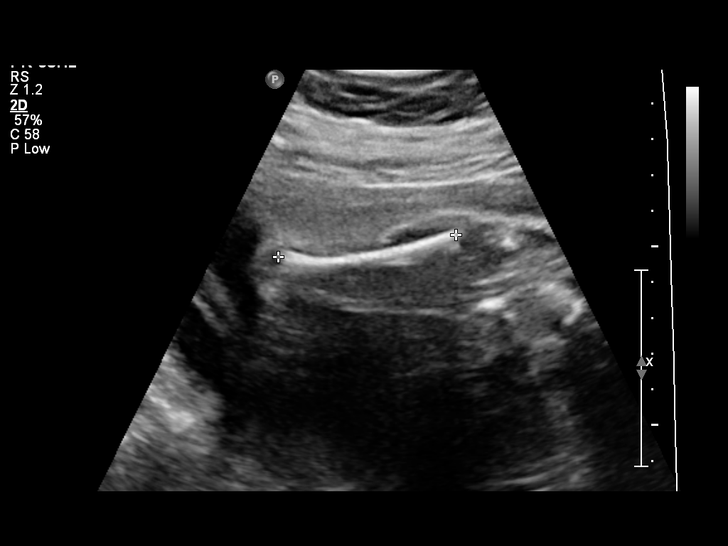
[im 53/118]
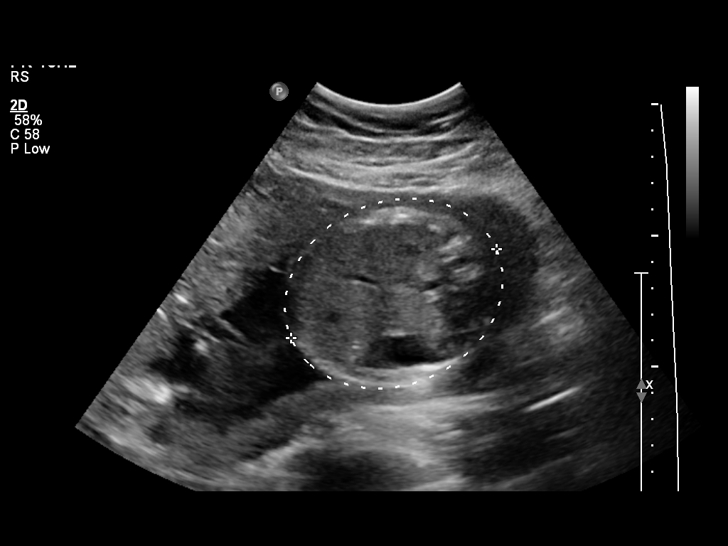
[im 66/118]
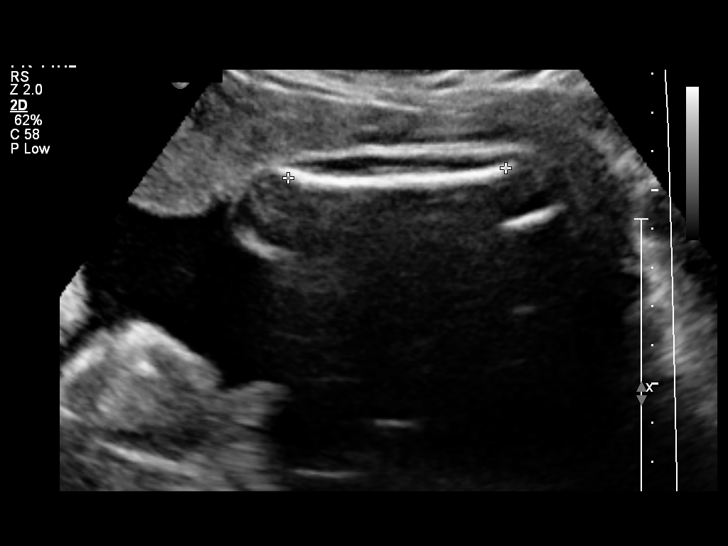
[im 74/118]
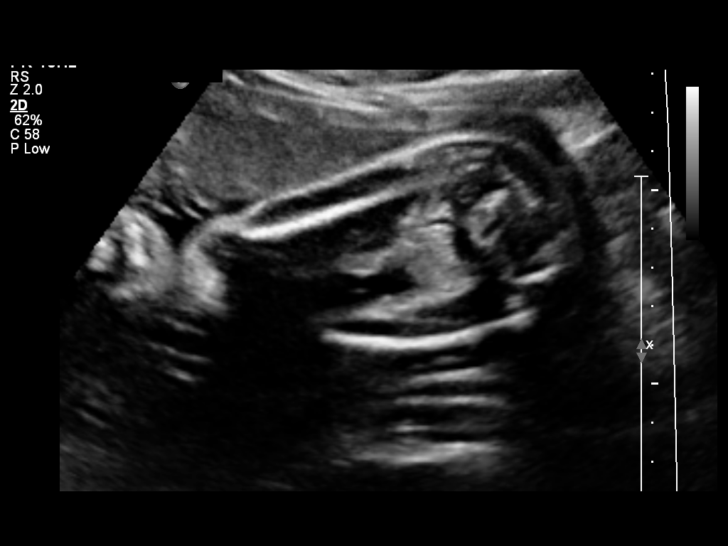
[im 83/118]
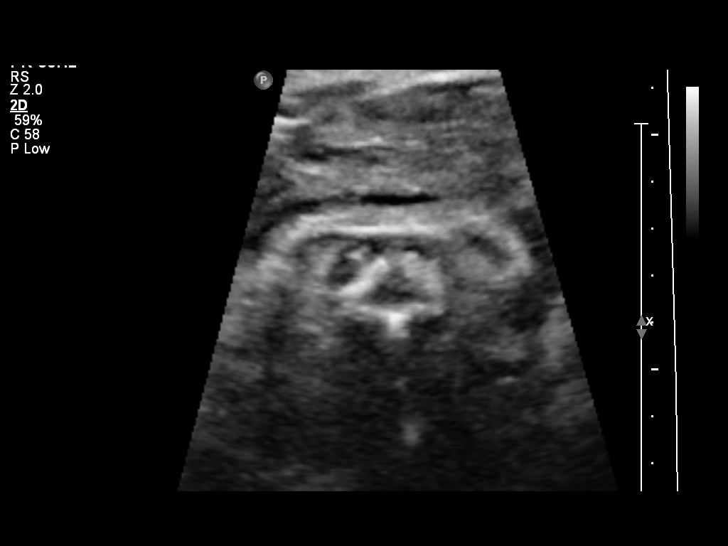
[im 96/118]
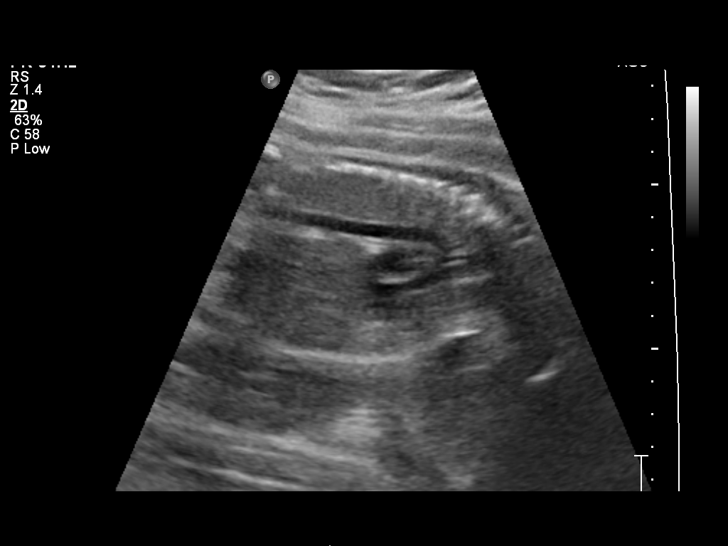
[im 105/118]
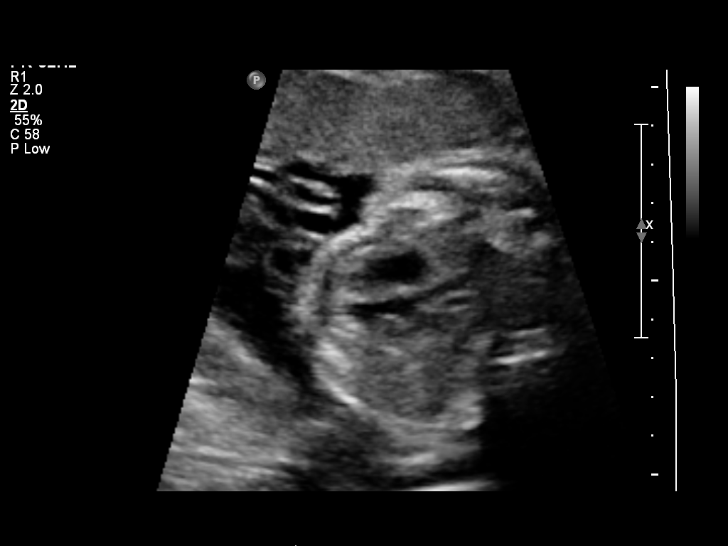
[im 113/118]
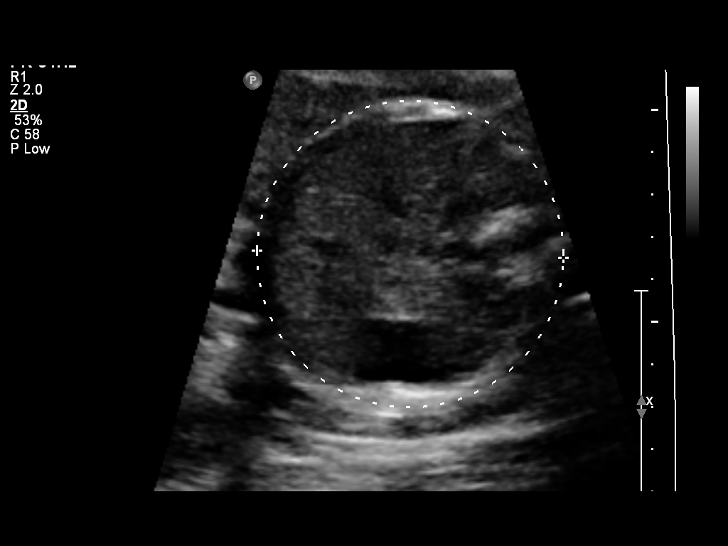

[12 of 28 positions shown; findings below may reference images not displayed]

OBSTETRICS REPORT
                      (Signed Final 12/24/2013 [DATE])

Service(s) Provided

 US OB COMP + 14 WK                                    76805.1
Indications

 Basic anatomic survey
 Maternal morbid obesity
 Size greater than dates (Large for gestational [AGE]
Fetal Evaluation

 Num Of Fetuses:    1
 Fetal Heart Rate:  143                          bpm
 Cardiac Activity:  Observed
 Presentation:      Cephalic
 Placenta:          Anterior, above cervical os
 P. Cord            Visualized, central
 Insertion:

 Amniotic Fluid
 AFI FV:      Subjectively within normal limits
                                             Larg Pckt:    5.67  cm
Biometry

 BPD:     70.9  mm     G. Age:  28w 3d                CI:        71.64   70 - 86
                                                      FL/HC:      21.2   19.6 -

 HC:     266.7  mm     G. Age:  29w 0d       20   %   HC/AC:      1.12   0.99 -

 AC:     239.1  mm     G. Age:  28w 1d       22   %   FL/BPD:     79.7   71 - 87
 FL:      56.5  mm     G. Age:  29w 5d       55   %   FL/AC:      23.6   20 - 24
 HUM:     49.9  mm     G. Age:  29w 2d       49   %
 CER:     36.3  mm     G. Age:  31w 2d       87   %

 Est. FW:    1444   gm   2 lb 13 oz      47  %
Gestational Age

 LMP:           29w 0d        Date:  06/04/13                 EDD:   03/11/14
 U/S Today:     28w 6d                                        EDD:   03/12/14
 Best:          29w 0d     Det. By:  LMP  (06/04/13)          EDD:   03/11/14
Anatomy
 Cranium:          Appears normal         Aortic Arch:      Appears normal
 Fetal Cavum:      Appears normal         Ductal Arch:      Not well visualized
 Ventricles:       Appears normal         Diaphragm:        Appears normal
 Choroid Plexus:   Appears normal         Stomach:          Appears normal, left
                                                            sided
 Cerebellum:       Appears normal         Abdomen:          Appears normal
 Posterior Fossa:  Appears normal         Abdominal Wall:   Appears nml (cord
                                                            insert, abd wall)
 Nuchal Fold:      Not applicable (>20    Cord Vessels:     Appears normal (3
                   wks GA)                                  vessel cord)
 Face:             Appears normal         Kidneys:          Appear normal
                   (orbits and profile)
 Lips:             Appears normal         Bladder:          Appears normal
 Heart:            Not well visualized    Spine:            Appears normal
 RVOT:             Not well visualized    Lower             Appears normal
                                          Extremities:
 LVOT:             Appears normal         Upper             Appears normal
                                          Extremities:

 Other:  Fetus appears to be a female. Heels visualized. Technically difficult
         due to maternal habitus and fetal position.
Targeted Anatomy

 Fetal Central Nervous System
 Lat. Ventricles:  3.9                    Cisterna Magna:
Cervix Uterus Adnexa

 Cervical Length:    3.06      cm

 Cervix:       Normal appearance by transabdominal scan.
 Uterus:       No abnormality visualized.
 Cul De Sac:   No free fluid seen.
 Left Ovary:    Size(cm) L: 2.55 x W: 2.16 x H: 1.78  Volume(cc):
 Right Ovary:   Not visualized.
 Adnexa:     No abnormality visualized.
Impression

 IUP at 29+0 weeks
 Normal detailed fetal anatomy; heart views were not optimally
 visualized
 Markers of aneuploidy: none
 Normal amniotic fluid volume
 Measurements consistent with LMP and early US; EFW at
 the 47th %tile
Recommendations

 Follow-up ultrasound in 4-6 weeks to complete anatomy
 survey

 questions or concerns.

## 2014-07-26 IMAGING — US US FETAL BPP W/O NONSTRESS
1 series · 11 of 11 positions shown · non-contrast
Comparison: none

[Series 1: us fetal bpp w/o nonstress · non-contrast · 11 acquisitions, 11 frames shown]
[im 1/11]
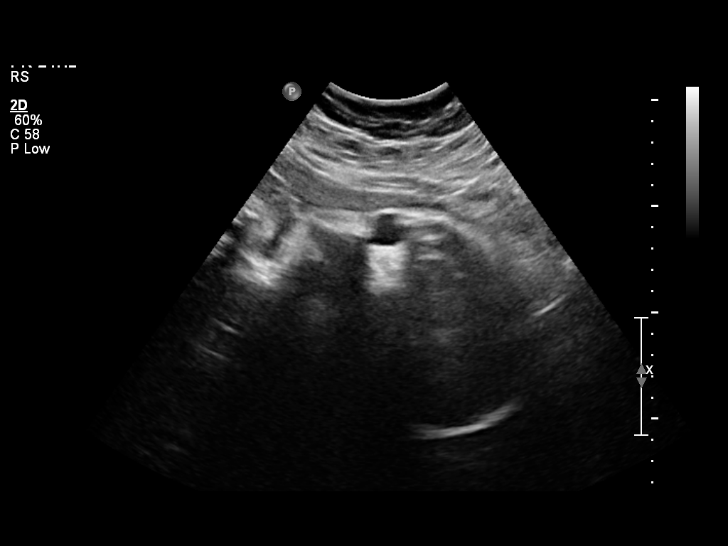
[im 2/11]
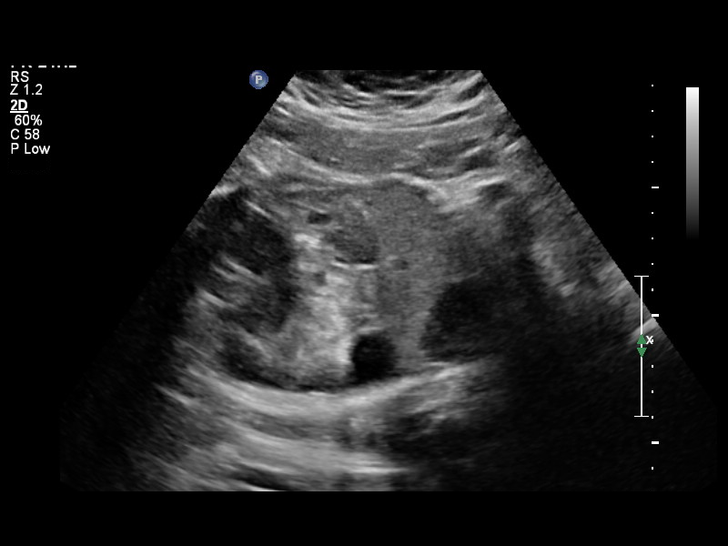
[im 3/11]
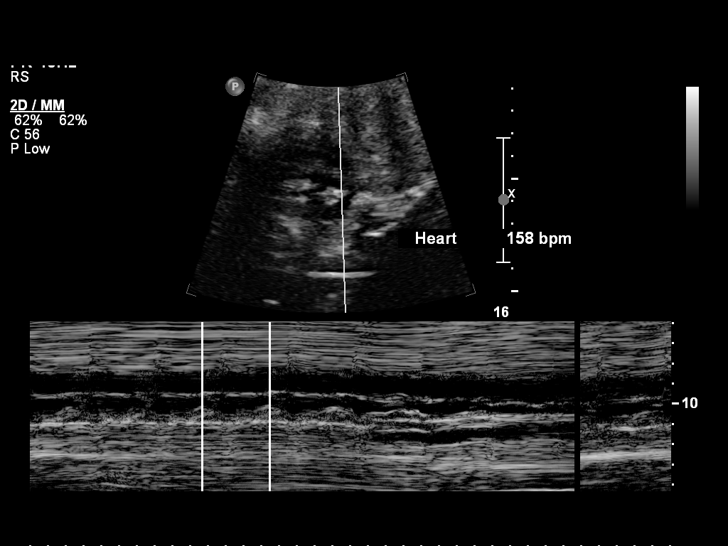
[im 4/11]
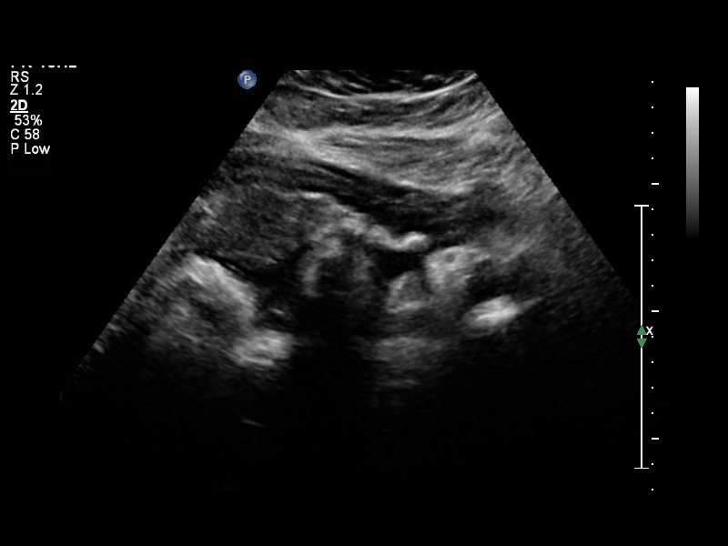
[im 5/11]
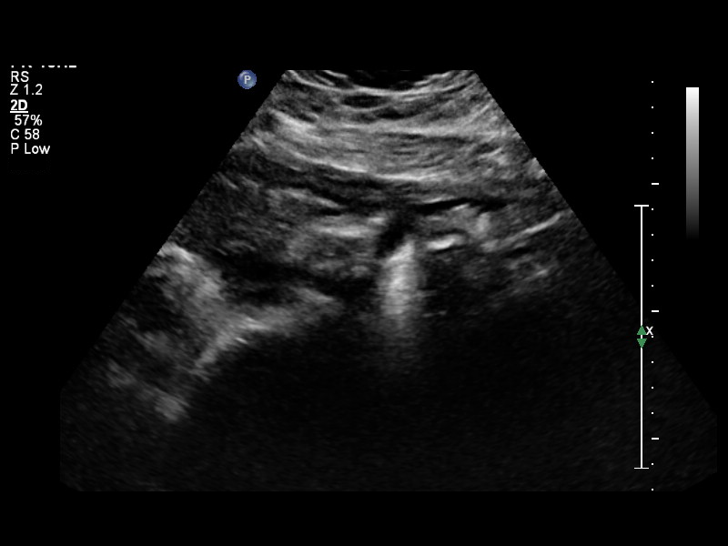
[im 6/11]
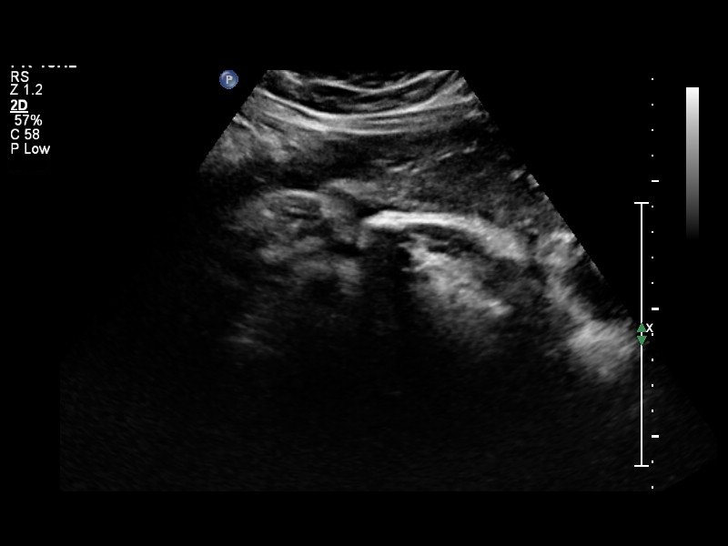
[im 7/11]
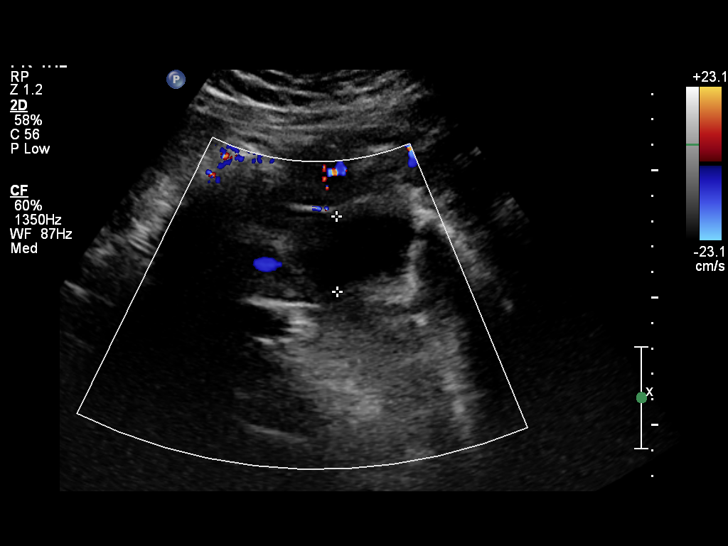
[im 8/11]
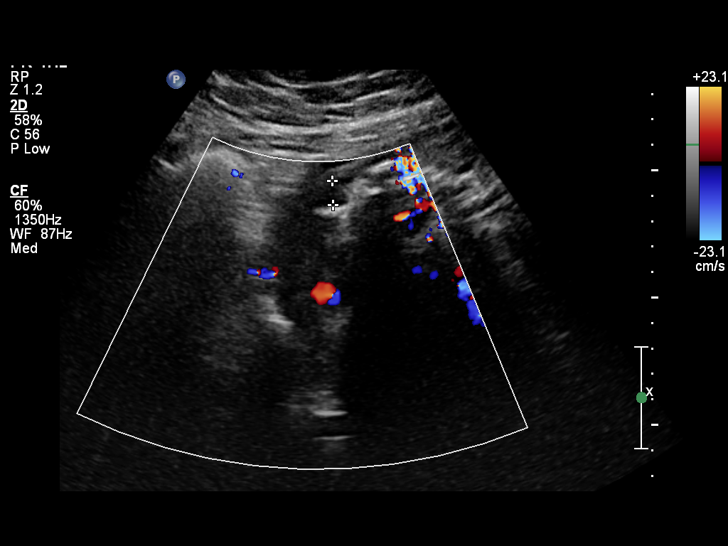
[im 9/11]
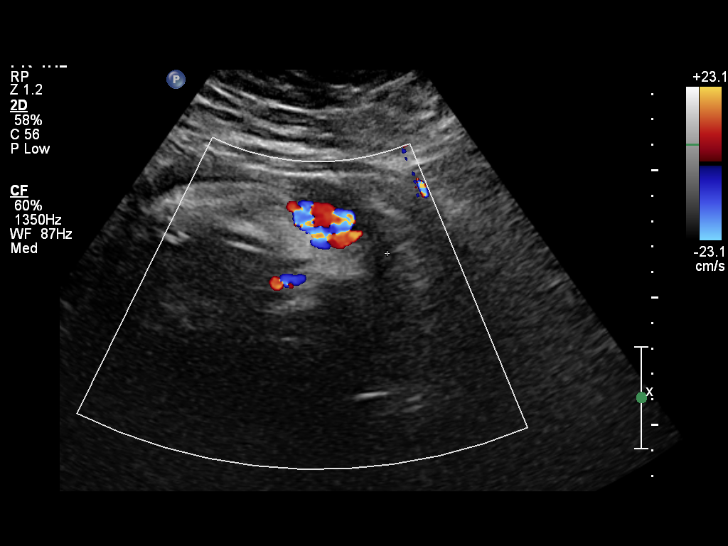
[im 10/11]
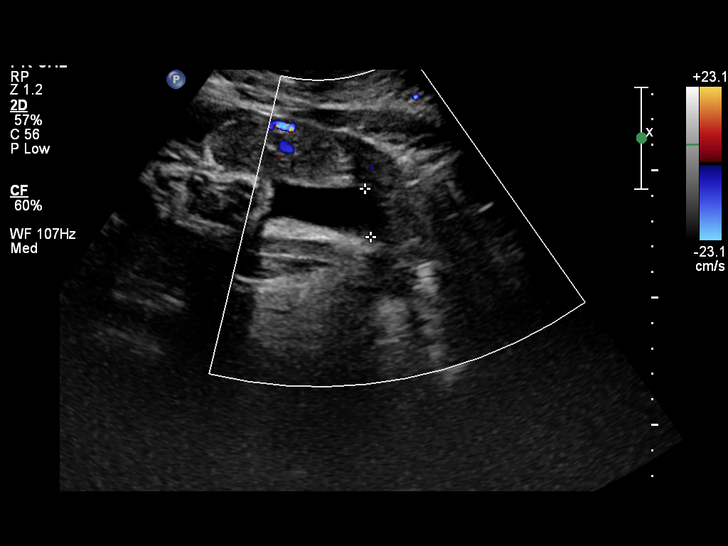
[im 11/11]
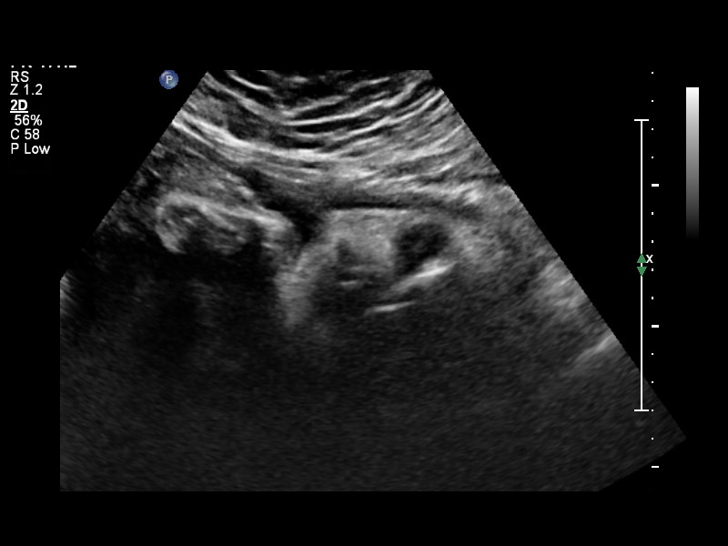

[11 of 11 positions shown; findings below may reference images not displayed]

OBSTETRICS REPORT
                      (Signed Final 03/09/2014 [DATE])

Service(s) Provided

Indications

 Non-reactive NST, FHR decelerations
Fetal Evaluation

 Num Of Fetuses:    1
 Fetal Heart Rate:  158                          bpm
 Cardiac Activity:  Observed
 Presentation:      Cephalic

 Comment:    BPP [DATE] in 13 minutes.

 Amniotic Fluid
 AFI FV:      Subjectively low-normal
 AFI Sum:     5.78    cm      < 3  %Tile     Larg Pckt:    2.94  cm
 RUQ:   2.94    cm   RLQ:    0.94   cm    LUQ:   1.9     cm   LLQ:    0      cm
Biophysical Evaluation

 Amniotic F.V:   Pocket => 2 cm two         F. Tone:        Observed
                 planes
 F. Movement:    Observed                   Score:          [DATE]
 F. Breathing:   Observed
Gestational Age

 LMP:           39w 5d        Date:  06/04/13                 EDD:   03/11/14
 Best:          39w 5d     Det. By:  LMP  (06/04/13)          EDD:   03/11/14
Impression

 Single IUP at 39 [DATE] weeks
 Active fetus with BPP of [DATE]
 Amniotic fluid volume subjectively decreased ( AFI - 5.8 cm)
Recommendations

 Follow-up ultrasounds as clinically indicated.
 questions or concerns.

## 2014-10-31 ENCOUNTER — Encounter: Payer: Self-pay | Admitting: Family Medicine
